# Patient Record
Sex: Female | Born: 1995 | Race: White | Hispanic: No | Marital: Married | Smoking: Never smoker
Health system: Southern US, Community
[De-identification: ages and names within clinical notes are randomized; demographics above are authoritative.]

## PROBLEM LIST (undated history)

## (undated) ENCOUNTER — Inpatient Hospital Stay: Payer: Self-pay

## (undated) DIAGNOSIS — F338 Other recurrent depressive disorders: Secondary | ICD-10-CM

## (undated) DIAGNOSIS — Z789 Other specified health status: Secondary | ICD-10-CM

## (undated) HISTORY — PX: NO PAST SURGERIES: SHX2092

## (undated) HISTORY — DX: Other recurrent depressive disorders: F33.8

---

## 2016-04-27 ENCOUNTER — Ambulatory Visit (INDEPENDENT_AMBULATORY_CARE_PROVIDER_SITE_OTHER): Payer: 59 | Admitting: Obstetrics and Gynecology

## 2016-04-27 ENCOUNTER — Encounter: Payer: Self-pay | Admitting: Obstetrics and Gynecology

## 2016-04-27 VITALS — BP 124/77 | HR 107 | Ht 67.0 in | Wt 180.1 lb

## 2016-04-27 DIAGNOSIS — N979 Female infertility, unspecified: Secondary | ICD-10-CM | POA: Diagnosis not present

## 2016-04-27 LAB — POCT URINE PREGNANCY: PREG TEST UR: NEGATIVE

## 2016-04-27 NOTE — Progress Notes (Signed)
HPI:      [name] is a 21 y.o. G1P1001 who LMP was Patient's last menstrual period was 04/24/2016 (exact date).  Subjective:   She presents today Stating that she has had unprotected intercourse for 4 years without resultant pregnancy. She reports that her menses are monthly and occur approximately at the same time. The exception is this last month her period was 2 weeks late and she states that over the last few months.. Has been a little bit irregular for her. She has done home pregnancy tests and they have occasionally been equivocal, so she is not sure if she has been pregnant and miscarried or not. She is currently bleeding like a menstrual period but it is 2 weeks late and would like a pregnancy test today. She does desire fertility. She and her current partner have a 67-year-old child.    Hx: The following portions of the patient's history were reviewed and updated as appropriate:              She  has no past medical history on file. She  does not have a problem list on file. She  has no past surgical history on file. Her family history includes Cancer in her maternal grandfather; Thyroid disease in her mother. She  reports that she has never smoked. She has never used smokeless tobacco. She reports that she drinks alcohol. She reports that she does not use drugs. No current outpatient prescriptions on file prior to visit.   No current facility-administered medications on file prior to visit.          Review of Systems:  Review of Systems  Constitutional: Denied constitutional symptoms, night sweats, recent illness, fatigue, fever, insomnia and weight loss.  Eyes: Denied eye symptoms, eye pain, photophobia, vision change and visual disturbance.  Ears/Nose/Throat/Neck: Denied ear, nose, throat or neck symptoms, hearing loss, nasal discharge, sinus congestion and sore throat.  Cardiovascular: Denied cardiovascular symptoms, arrhythmia, chest pain/pressure, edema, exercise  intolerance, orthopnea and palpitations.  Respiratory: Denied pulmonary symptoms, asthma, pleuritic pain, productive sputum, cough, dyspnea and wheezing.  Gastrointestinal: Denied, gastro-esophageal reflux, melena, nausea and vomiting.  Genitourinary: Denied genitourinary symptoms including symptomatic vaginal discharge, pelvic relaxation issues, and urinary complaints.  Musculoskeletal: Denied musculoskeletal symptoms, stiffness, swelling, muscle weakness and myalgia.  Dermatologic: Denied dermatology symptoms, rash and scar.  Neurologic: Denied neurology symptoms, dizziness, headache, neck pain and syncope.  Psychiatric: Denied psychiatric symptoms, anxiety and depression.  Endocrine: Denied endocrine symptoms including hot flashes and night sweats.   Meds:   No current outpatient prescriptions on file prior to visit.   No current facility-administered medications on file prior to visit.     Objective:     Vitals:   04/27/16 0836  BP: 124/77  Pulse: (!) 107    Urine pregnancy test negative            Assessment:    G1P1001 There are no active problems to display for this patient.    1. 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 Also possible this is a female factor problem.   Plan:            1.  PCO labs  2.  Semen analysis  3. We have discussed menstrual timing and use of ovulation predictor kits as well.    Orders Orders Placed This Encounter  Procedures  . DHEA-sulfate  . FSH/LH  .  Prolactin  . Testosterone, Free, Total, SHBG  . TSH  . Semen Analysis, Basic  . POCT urine pregnancy     Meds ordered this encounter  Medications  . b complex vitamins capsule    Sig: Take 1 capsule by mouth daily.        F/U  Return in about 2 weeks (around 05/11/2016). I spent 30 minutes with this patient of which greater than 50% was spent discussing infertility causes and  treatments, female infertility, timing of intercourse, ovulation predictor kits etc.   Elonda Huskyavid J. Morgana Rowley, M.D. 04/27/2016 10:07 AM

## 2016-05-03 ENCOUNTER — Other Ambulatory Visit: Payer: Self-pay

## 2016-05-11 ENCOUNTER — Ambulatory Visit: Payer: 59 | Admitting: Obstetrics and Gynecology

## 2016-08-25 ENCOUNTER — Ambulatory Visit (INDEPENDENT_AMBULATORY_CARE_PROVIDER_SITE_OTHER): Payer: 59 | Admitting: Obstetrics and Gynecology

## 2016-08-25 ENCOUNTER — Encounter: Payer: Self-pay | Admitting: Obstetrics and Gynecology

## 2016-08-25 VITALS — BP 113/75 | HR 80 | Ht 67.0 in | Wt 187.4 lb

## 2016-08-25 DIAGNOSIS — N926 Irregular menstruation, unspecified: Secondary | ICD-10-CM

## 2016-08-25 LAB — POCT URINE PREGNANCY: Preg Test, Ur: POSITIVE — AB

## 2016-08-25 NOTE — Progress Notes (Signed)
HPI:      [name] is a 21 y.o. G2P1001 who LMP was Patient's last menstrual period was 07/06/2016 (exact date).  Subjective:   She presents today For pregnancy confirmation. This was a planned pregnancy. She is approximate 7 weeks by last menstrual period. She has nausea but no vomiting. Other signs of pregnancy include breast tenderness. Her last pregnancy was uncomplicated. She was induced at term and had a very rapid labor.-Vaginal birth.    Hx: The following portions of the patient's history were reviewed and updated as appropriate:             She  has no past medical history on file. She  does not have a problem list on file. She  has no past surgical history on file. Her family history includes Cancer in her maternal grandfather; Thyroid disease in her mother. She  reports that she has never smoked. She has never used smokeless tobacco. She reports that she does not drink alcohol or use drugs. She has no allergies on file.       Review of Systems:  Review of Systems  Constitutional: Denied constitutional symptoms, night sweats, recent illness, fatigue, fever, insomnia and weight loss.  Eyes: Denied eye symptoms, eye pain, photophobia, vision change and visual disturbance.  Ears/Nose/Throat/Neck: Denied ear, nose, throat or neck symptoms, hearing loss, nasal discharge, sinus congestion and sore throat.  Cardiovascular: Denied cardiovascular symptoms, arrhythmia, chest pain/pressure, edema, exercise intolerance, orthopnea and palpitations.  Respiratory: Denied pulmonary symptoms, asthma, pleuritic pain, productive sputum, cough, dyspnea and wheezing.  Gastrointestinal: Denied, gastro-esophageal reflux, melena, nausea and vomiting.  Genitourinary: Denied genitourinary symptoms including symptomatic vaginal discharge, pelvic relaxation issues, and urinary complaints.  Musculoskeletal: Denied musculoskeletal symptoms, stiffness, swelling, muscle weakness and myalgia.   Dermatologic: Denied dermatology symptoms, rash and scar.  Neurologic: Denied neurology symptoms, dizziness, headache, neck pain and syncope.  Psychiatric: Denied psychiatric symptoms, anxiety and depression.  Endocrine: Denied endocrine symptoms including hot flashes and night sweats.   Meds:   Current Outpatient Prescriptions on File Prior to Visit  Medication Sig Dispense Refill  . b complex vitamins capsule Take 1 capsule by mouth daily.     No current facility-administered medications on file prior to visit.     Objective:     Vitals:   08/25/16 1342  BP: 113/75  Pulse: 80              Urine HCG test positive  Assessment:    G2P1001 There are no active problems to display for this patient.    1. Missed menses     Partially 7 weeks estimated gestational age.   Plan:           Prenatal Plan 1.  The patient was given prenatal literature. 2.  She was continued on prenatal vitamins. 3.  A prenatal lab panel was ordered or drawn. 4.  An ultrasound was ordered to better determine an EDC. 5.  A nurse visit was scheduled. 6.  Pap GC/CT performed with pelvic exam.  Orders Orders Placed This Encounter  Procedures  . US OB Comp Less 14 Wks  . POCT urine pregnancy    No orders of the defined types were placed in this encounter.       F/U  Return in about 5 weeks (around 09/29/2016). I spent 30 minutes with this patient of which greater than 50% was spent discussing prenatal history, current condition, prenatal vitamins, future care at [facility], future visits.  Onalee Huaavid  Danne HarborJ. Marquell Saenz, M.D. 08/25/2016 2:04 PM

## 2016-09-01 ENCOUNTER — Ambulatory Visit (INDEPENDENT_AMBULATORY_CARE_PROVIDER_SITE_OTHER): Payer: 59

## 2016-09-01 DIAGNOSIS — N926 Irregular menstruation, unspecified: Secondary | ICD-10-CM | POA: Diagnosis not present

## 2016-09-14 ENCOUNTER — Ambulatory Visit: Payer: 59 | Admitting: Obstetrics and Gynecology

## 2016-09-14 VITALS — BP 104/64 | HR 58 | Wt 182.2 lb

## 2016-09-14 DIAGNOSIS — Z3492 Encounter for supervision of normal pregnancy, unspecified, second trimester: Secondary | ICD-10-CM

## 2016-09-14 NOTE — Progress Notes (Signed)
Elizabeth Rush presents for NOB nurse interview visit. Pregnancy confirmation done here at [facility]. G- .2  P- 1. Pregnancy education material explained and given_There is cats in the home however she doesn't change the litter box. HIV labs and Drug screen were explained optional and she did not decline. Drug screen ordered PNV encouraged. Genetic screening options discussed. Genetic testing:/Unsure.  Pt may discuss with provider. Pt. To follow up with provider in _2_ weeks for NOB physical.  All questions answered.

## 2016-09-19 ENCOUNTER — Encounter: Payer: Self-pay | Admitting: Emergency Medicine

## 2016-09-19 ENCOUNTER — Emergency Department
Admission: EM | Admit: 2016-09-19 | Discharge: 2016-09-19 | Disposition: A | Discharge status: Home / Self Care | Payer: BLUE CROSS/BLUE SHIELD | Attending: Emergency Medicine | Admitting: Emergency Medicine

## 2016-09-19 DIAGNOSIS — Z711 Person with feared health complaint in whom no diagnosis is made: Secondary | ICD-10-CM | POA: Insufficient documentation

## 2016-09-19 DIAGNOSIS — L819 Disorder of pigmentation, unspecified: Secondary | ICD-10-CM | POA: Diagnosis present

## 2016-09-19 NOTE — ED Triage Notes (Signed)
Pt ambulatory to triage stating that she was bit by a tick about a month and a half ago. Tick was removed at the walk in clinic. Pt states that this morning she noticed a "tape worm" in her ankle where the tick bit her.Pt denies fever and body aches. Pt is approximately [redacted] weeks pregnant. No complaints related to pregnancy at this time.

## 2016-09-19 NOTE — Discharge Instructions (Signed)
Follow-up with your primary care doctor if any continued problems. Wear socks with shoes.

## 2016-09-19 NOTE — ED Provider Notes (Signed)
[facility] Emergency Department Provider Note  ____________________________________________   First MD Initiated Contact with Patient 09/19/16 1049     (approximate)  I have reviewed the triage vital signs and the nursing notes.   HISTORY  Chief Complaint tick bite   HPI Elizabeth Rush is a 21 y.o. female is here with complaint of an area on the back for ankle. Patient states that the skin looks different and is worried. Patient states that she was bit by a tick approximately one half months ago. She denies any fever or chills. She denies any itching. Husband states that she does not wear socks with her shoes. She denies any pain. Patient is [redacted] weeks pregnant.   Past Medical History:  Diagnosis Date  . Seasonal affective disorder (HCC)     There are no active problems to display for this patient.   History reviewed. No pertinent surgical history.  Prior to Admission medications   Medication Sig Start Date End Date Taking? Authorizing Provider  b complex vitamins capsule Take 1 capsule by mouth daily.    [provider]    Allergies Patient has no known allergies.  Family History  Problem Relation Age of Onset  . Thyroid disease Mother   . Cancer Maternal Grandfather     Social History Social History  Substance Use Topics  . Smoking status: Never Smoker  . Smokeless tobacco: Never Used  . Alcohol use No     Comment: occas    Review of Systems Constitutional: No fever/chills Cardiovascular: Denies chest pain. Respiratory: Denies shortness of breath. Skin: Positive for questionable skin problem to heal. Neurological: Negative for headaches, focal weakness or numbness.   ____________________________________________   PHYSICAL EXAM:  VITAL SIGNS: ED Triage Vitals [09/19/16 1001]  Enc Vitals Group     BP (!) 121/95     Pulse Rate 82     Resp 16     Temp 98.5 F (36.9 C)     Temp Source Oral     SpO2 100 %   Weight 181 lb (82.1 kg)     Height      Head Circumference      Peak Flow      Pain Score      Pain Loc      Pain Edu?      Excl. in GC?     Constitutional: Alert and oriented. Well appearing and in no acute distress. Eyes: Conjunctivae are normal. PERRL. EOMI. Head: Atraumatic. Neck: No stridor.   Cardiovascular: Normal rate, regular rhythm. Grossly normal heart sounds.  Good peripheral circulation. Respiratory: Normal respiratory effort.  No retractions. Lungs CTAB. Musculoskeletal: Examination of the foot and ankle there is no gross deformity and no soft tissue swelling present. Range of motion is without restriction. Nontender to palpation. Neurologic:  Normal speech and language. No gross focal neurologic deficits are appreciated. No gait instability. Skin:  Skin is warm, dry. There is a very superficial linear of skin that has been removed. This is questionably from wearing shoes without any socks. Area does not show any signs of infection. Psychiatric: Mood and affect are normal. Speech and behavior are normal.  ____________________________________________   LABS (all labs ordered are listed, but only abnormal results are displayed)  Labs Reviewed - No data to display   PROCEDURES  Procedure(s) performed: None  Procedures  Critical Care performed: No  ____________________________________________   INITIAL IMPRESSION / ASSESSMENT AND PLAN / ED COURSE  Pertinent labs &  imaging results that were available during my care of the patient were reviewed by me and considered in my medical decision making (see chart for details).  Patient was reassured that this is not a ringworm or complications from her tick bite. She was reassured that there is no skin infection. She'll follow-up with her PCP if any continued problems.   ____________________________________________   FINAL CLINICAL IMPRESSION(S) / ED DIAGNOSES  Final diagnoses:  Person with feared complaint, no  diagnosis made      NEW MEDICATIONS STARTED DURING THIS VISIT:  Discharge Medication List as of 09/19/2016 11:25 AM       Note:  This document was prepared using Dragon voice recognition software and may include unintentional dictation errors.    Tommi Rumps, PA-C 09/19/16 1339    Tommi Rumps, PA-C 09/19/16 1341    Phineas Semen, MD 09/19/16 [phone]

## 2016-09-29 ENCOUNTER — Encounter: Payer: 59 | Admitting: Obstetrics and Gynecology

## 2016-09-29 ENCOUNTER — Encounter: Payer: Self-pay | Admitting: Certified Nurse Midwife

## 2016-09-29 ENCOUNTER — Ambulatory Visit (INDEPENDENT_AMBULATORY_CARE_PROVIDER_SITE_OTHER): Payer: 59 | Admitting: Certified Nurse Midwife

## 2016-09-29 VITALS — BP 125/87 | HR 85 | Wt 179.8 lb

## 2016-09-29 DIAGNOSIS — Z3A12 12 weeks gestation of pregnancy: Secondary | ICD-10-CM

## 2016-09-29 DIAGNOSIS — Z124 Encounter for screening for malignant neoplasm of cervix: Secondary | ICD-10-CM

## 2016-09-29 DIAGNOSIS — Z3481 Encounter for supervision of other normal pregnancy, first trimester: Secondary | ICD-10-CM

## 2016-09-29 NOTE — Addendum Note (Signed)
Addended by: Jackquline DenmarkIDGEWAY, Lissy Deuser W on: 09/29/2016 04:58 PM   Modules accepted: Orders

## 2016-09-29 NOTE — Patient Instructions (Signed)

## 2016-09-29 NOTE — Progress Notes (Signed)
NEW OB HISTORY AND PHYSICAL  SUBJECTIVE:       Elizabeth Rush is a 21 y.o. G73P1001 female, Patient's last menstrual period was 07/06/2016 (exact date)., Estimated Date of Delivery: 04/12/17, [redacted]w[redacted]d, presents today for establishment of Prenatal Care. She has no unusual complaints    Gynecologic History Patient's last menstrual period was 07/06/2016 (exact date). Normal Contraception: none Last Pap: a few yrs ago Results were: normal  Obstetric History OB History  Gravida Para Term Preterm AB Living  2 1 1     1   SAB TAB Ectopic Multiple Live Births          1    # Outcome Date GA Lbr Len/2nd Weight Sex Delivery Anes PTL Lv  2 Current           1 Term 2014    F Vag-Spont  N LIV      Past Medical History:  Diagnosis Date  . Seasonal affective disorder (HCC)     No past surgical history on file.  Current Outpatient Prescriptions on File Prior to Visit  Medication Sig Dispense Refill  . b complex vitamins capsule Take 1 capsule by mouth daily.     No current facility-administered medications on file prior to visit.     No Known Allergies  Social History   Social History  . Marital status: Married    Spouse name: N/A  . Number of children: N/A  . Years of education: N/A   Occupational History  . Not on file.   Social History Main Topics  . Smoking status: Never Smoker  . Smokeless tobacco: Never Used  . Alcohol use No     Comment: occas  . Drug use: No  . Sexual activity: Yes    Birth control/ protection: None   Other Topics Concern  . Not on file   Social History Narrative  . No narrative on file    Family History  Problem Relation Age of Onset  . Thyroid disease Mother   . Cancer Maternal Grandfather     The following portions of the patient's history were reviewed and updated as appropriate: allergies, current medications, past OB history, past medical history, past surgical history, past family history, past social history, and problem  list.    OBJECTIVE: Initial Physical Exam (New OB)  GENERAL APPEARANCE: alert, well appearing, in no apparent distress, oriented to person, place and time HEAD: normocephalic, atraumatic MOUTH: mucous membranes moist, pharynx normal without lesions THYROID: no thyromegaly or masses present and enlarged on left side, pt states she has had it checked. BREASTS: no masses noted, no significant tenderness, no palpable axillary nodes, no skin changes LUNGS: clear to auscultation, no wheezes, rales or rhonchi, symmetric air entry HEART: regular rate and rhythm, no murmurs ABDOMEN: soft, nontender, nondistended, no abnormal masses, no epigastric pain and FHT present EXTREMITIES: no redness or tenderness in the calves or thighs, no edema, no limitation in range of motion SKIN: normal coloration and turgor, no rashes LYMPH NODES: no adenopathy palpable NEUROLOGIC: alert, oriented, normal speech, no focal findings or movement disorder noted  PELVIC EXAM EXTERNAL GENITALIA: normal appearing vulva with no masses, tenderness or lesions VAGINA: no abnormal discharge or lesions CERVIX: no lesions or cervical motion tenderness and contact bleeding with pap smear UTERUS: gravid ADNEXA: no masses palpable and nontender OB EXAM PELVIMETRY: appears adequate RECTUM: exam not indicated  ASSESSMENT: Normal pregnancy  PLAN: New OB counseling: The patient has been given an overview regarding routine prenatal  care. Recommendations regarding diet, weight gain, and exercise in pregnancy were given. Prenatal testing, optional genetic testing, and carrier screening discussed.  Ultrasound use in pregnancy were reviewed. She denies taking PNV at this time stating that it does not make her feel any better. Reviewed benefits of PNV with folic acid, discussed using [employer] vitamins and folic acid or offered to give her additional samples . She declines samples stating that she will get [employer] vitamins & folic  acid.   Benefits of Breast Feeding were discussed. The patient is encouraged to consider nursing her baby post partum.  Doreene Burke, CNM

## 2016-09-29 NOTE — Addendum Note (Signed)
Addended by: Jackquline Denmark on: 09/29/2016 04:34 PM   Modules accepted: Orders

## 2016-09-30 LAB — URINALYSIS, ROUTINE W REFLEX MICROSCOPIC
BILIRUBIN UA: NEGATIVE
GLUCOSE, UA: NEGATIVE
KETONES UA: NEGATIVE
LEUKOCYTES UA: NEGATIVE
NITRITE UA: NEGATIVE
Protein, UA: NEGATIVE
RBC UA: NEGATIVE
SPEC GRAV UA: 1.018 (ref 1.005–1.030)
Urobilinogen, Ur: 0.2 mg/dL (ref 0.2–1.0)
pH, UA: 7 (ref 5.0–7.5)

## 2016-09-30 LAB — ABO AND RH: RH TYPE: POSITIVE

## 2016-09-30 LAB — CBC WITH DIFFERENTIAL/PLATELET
BASOS: 0 %
Basophils Absolute: 0 10*3/uL (ref 0.0–0.2)
EOS (ABSOLUTE): 0 10*3/uL (ref 0.0–0.4)
EOS: 0 %
HEMOGLOBIN: 14 g/dL (ref 11.1–15.9)
Hematocrit: 40.5 % (ref 34.0–46.6)
IMMATURE GRANS (ABS): 0 10*3/uL (ref 0.0–0.1)
Immature Granulocytes: 0 %
LYMPHS: 22 %
Lymphocytes Absolute: 2.2 10*3/uL (ref 0.7–3.1)
MCH: 29.4 pg (ref 26.6–33.0)
MCHC: 34.6 g/dL (ref 31.5–35.7)
MCV: 85 fL (ref 79–97)
MONOCYTES: 7 %
Monocytes Absolute: 0.7 10*3/uL (ref 0.1–0.9)
NEUTROS ABS: 7 10*3/uL (ref 1.4–7.0)
Neutrophils: 71 %
Platelets: 286 10*3/uL (ref 150–379)
RBC: 4.76 x10E6/uL (ref 3.77–5.28)
RDW: 13.4 % (ref 12.3–15.4)
WBC: 10 10*3/uL (ref 3.4–10.8)

## 2016-09-30 LAB — VARICELLA ZOSTER ANTIBODY, IGG: Varicella zoster IgG: 2296 index (ref >165)

## 2016-09-30 LAB — HEPATITIS B SURFACE ANTIGEN: Hepatitis B Surface Ag: NEGATIVE

## 2016-09-30 LAB — RPR: RPR Ser Ql: NONREACTIVE

## 2016-09-30 LAB — HIV ANTIBODY (ROUTINE TESTING W REFLEX): HIV Screen 4th Generation wRfx: NONREACTIVE

## 2016-09-30 LAB — RUBELLA SCREEN: RUBELLA: 2.85 {index} (ref >0.99)

## 2016-09-30 LAB — ANTIBODY SCREEN: Antibody Screen: NEGATIVE

## 2016-10-01 LAB — MONITOR DRUG PROFILE 14(MW)
Amphetamine Scrn, Ur: NEGATIVE ng/mL
BARBITURATE SCREEN URINE: NEGATIVE ng/mL
BENZODIAZEPINE SCREEN, URINE: NEGATIVE ng/mL
BUPRENORPHINE, URINE: NEGATIVE ng/mL
CANNABINOIDS UR QL SCN: NEGATIVE ng/mL
CREATININE(CRT), U: 97.3 mg/dL (ref 20.0–300.0)
Cocaine (Metab) Scrn, Ur: NEGATIVE ng/mL
Fentanyl, Urine: NEGATIVE pg/mL
METHADONE SCREEN, URINE: NEGATIVE ng/mL
Meperidine Screen, Urine: NEGATIVE ng/mL
OPIATE SCREEN URINE: NEGATIVE ng/mL
OXYCODONE+OXYMORPHONE UR QL SCN: NEGATIVE ng/mL
PHENCYCLIDINE QUANTITATIVE URINE: NEGATIVE ng/mL
Ph of Urine: 6.8 (ref 4.5–8.9)
Propoxyphene Scrn, Ur: NEGATIVE ng/mL
SPECIFIC GRAVITY: 1.021
Tramadol Screen, Urine: NEGATIVE ng/mL

## 2016-10-01 LAB — NICOTINE SCREEN, URINE: COTININE UR QL SCN: NEGATIVE ng/mL

## 2016-10-01 LAB — URINE CULTURE: ORGANISM ID, BACTERIA: NO GROWTH

## 2016-10-01 LAB — GC/CHLAMYDIA PROBE AMP
Chlamydia trachomatis, NAA: NEGATIVE
NEISSERIA GONORRHOEAE BY PCR: NEGATIVE

## 2016-10-06 LAB — PAP IG W/ RFLX HPV ASCU: PAP Smear Comment: 0

## 2016-10-27 ENCOUNTER — Ambulatory Visit (INDEPENDENT_AMBULATORY_CARE_PROVIDER_SITE_OTHER): Payer: 59 | Admitting: Obstetrics and Gynecology

## 2016-10-27 ENCOUNTER — Encounter: Payer: Self-pay | Admitting: Obstetrics and Gynecology

## 2016-10-27 VITALS — BP 117/72 | HR 88 | Wt 177.1 lb

## 2016-10-27 DIAGNOSIS — Z3492 Encounter for supervision of normal pregnancy, unspecified, second trimester: Secondary | ICD-10-CM

## 2016-10-27 LAB — POCT URINALYSIS DIPSTICK
BILIRUBIN UA: NEGATIVE
Blood, UA: NEGATIVE
Glucose, UA: NEGATIVE
KETONES UA: NEGATIVE
LEUKOCYTES UA: NEGATIVE
Nitrite, UA: NEGATIVE
PROTEIN UA: NEGATIVE
Spec Grav, UA: 1.01 (ref 1.010–1.025)
Urobilinogen, UA: 0.2 E.U./dL
pH, UA: 7 (ref 5.0–8.0)

## 2016-10-27 NOTE — Progress Notes (Signed)
[name]-doing well except tiredness. Anatomy scan next visit.

## 2016-10-27 NOTE — Progress Notes (Signed)
[name]- pt is doing well, denies any complaints 

## 2016-11-25 ENCOUNTER — Ambulatory Visit (INDEPENDENT_AMBULATORY_CARE_PROVIDER_SITE_OTHER): Payer: 59 | Admitting: Certified Nurse Midwife

## 2016-11-25 ENCOUNTER — Ambulatory Visit (INDEPENDENT_AMBULATORY_CARE_PROVIDER_SITE_OTHER): Payer: 59

## 2016-11-25 VITALS — BP 103/72 | HR 69 | Wt 175.4 lb

## 2016-11-25 DIAGNOSIS — Z3492 Encounter for supervision of normal pregnancy, unspecified, second trimester: Secondary | ICD-10-CM | POA: Diagnosis not present

## 2016-11-25 NOTE — Progress Notes (Signed)
Pt had anatomy scan today but unable to see provider due to provider being at hospital with deliveries. They asked if they could see provider in 2 wks when they have an anatomy f/u due to some views due to positioning of baby. Pt states she has no issue/concerns.

## 2016-11-25 NOTE — Progress Notes (Signed)
Telephone call to patient verified full name and date of birth. Anatomy scan reviewed with pt-incomplete yet normal, verbalized understanding. Reviewed red flag symptoms and when to call. RTC x 2 weeks for follow US and [name] or sooner if needed.    ULTRASOUND REPORT  Location: [facility] Date of Service: 11/25/16  Indications:Anatomy Findings:  Mason JimSingleton intrauterine pregnancy is visualized with FHR at 158 BPM. Biometrics give an (U/S) Gestational age of 21 6/7 weeks and an (U/S) EDD of 04/15/17; this correlates with the clinically established EDD of 04/12/17.  Fetal presentation is footling breech.  EFW: 325 grams (0lb 11oz). Placenta: Anterior and grade 1.  Placenta is 5.8cm from cervical os. AFI: WNL subjectively.  Anatomic survey is incomplete.  Views of the 4-chamber heart, kidneys, profile, open hand, and 5th digit are needed to complete survey. Gender - does not want to know at this time.   Right Ovary measures 2.9 x 2.3 x 2.6 cm and appears WNL.  Left Ovary measures 4.7 x 2.4 x 3.5 cm and appears WNL.  There is no obvious evidence of a corpus luteal cyst. Survey of the adnexa demonstrates no adnexal masses. There is no free peritoneal fluid in the cul de sac.  Impression: 1. 19 6/7 week Viable Singleton Intrauterine pregnancy by U/S. 2. (U/S) EDD is consistent with Clinically established (LMP) EDD of 04/12/17. 3. Incomplete anatomy scan  Recommendations: 1.Clinical correlation with the patient's History and Physical Exam. 2. F/U U/S recommended in 2 weeks to complete anatomical survey.

## 2016-12-13 ENCOUNTER — Ambulatory Visit (INDEPENDENT_AMBULATORY_CARE_PROVIDER_SITE_OTHER): Payer: 59

## 2016-12-13 ENCOUNTER — Ambulatory Visit (INDEPENDENT_AMBULATORY_CARE_PROVIDER_SITE_OTHER): Payer: 59 | Admitting: Certified Nurse Midwife

## 2016-12-13 DIAGNOSIS — Z3492 Encounter for supervision of normal pregnancy, unspecified, second trimester: Secondary | ICD-10-CM | POA: Diagnosis not present

## 2016-12-13 DIAGNOSIS — Z3689 Encounter for other specified antenatal screening: Secondary | ICD-10-CM

## 2016-12-13 NOTE — Progress Notes (Signed)
Patient unable to stay for [name]; however anatomy scan complete and normal.   ULTRASOUND REPORT  Location: [facility] Date of Service:  12/13/16  Indications: F/U Anatomy Findings:  Singleton intrauterine pregnancy is visualized with FHR at 150 BPM.  Fetal presentation is vertex.  Placenta: Anterior and grade 1. AFI: WNL subjectively.  Anatomic survey is now complete after obtaining images of the 4-chamber heart, kidneys, and profile.  Fetal stomach, kidneys, and bladder appears WNL. Gender - Female.    Impression: 1. Anatomy scan is now complete and appears WNL.  Recommendations: 1.Clinical correlation with the patient's History and Physical Exam.

## 2017-01-10 ENCOUNTER — Encounter: Payer: 59 | Admitting: Certified Nurse Midwife

## 2017-01-14 ENCOUNTER — Ambulatory Visit (INDEPENDENT_AMBULATORY_CARE_PROVIDER_SITE_OTHER): Payer: No Typology Code available for payment source | Admitting: Certified Nurse Midwife

## 2017-01-14 ENCOUNTER — Encounter: Payer: Self-pay | Admitting: Certified Nurse Midwife

## 2017-01-14 VITALS — BP 105/79 | HR 94 | Wt 177.6 lb

## 2017-01-14 DIAGNOSIS — Z23 Encounter for immunization: Secondary | ICD-10-CM

## 2017-01-14 DIAGNOSIS — Z3482 Encounter for supervision of other normal pregnancy, second trimester: Secondary | ICD-10-CM

## 2017-01-14 LAB — POCT URINALYSIS DIPSTICK
Blood, UA: NEGATIVE
Glucose, UA: NEGATIVE
KETONES UA: NEGATIVE
NITRITE UA: NEGATIVE
ODOR: NEGATIVE
PH UA: 7.5 (ref 5.0–8.0)
Spec Grav, UA: 1.015 (ref 1.010–1.025)
UROBILINOGEN UA: 0.2 U/dL

## 2017-01-14 NOTE — Progress Notes (Signed)
[name], doing well no complains . Since she is a little early will have her return in 1 wk for gtt. Tdap/BTC today. Follow up 1 wk for labs then 2 1/2 wks for [name] .   Doreene BurkeAnnie Eriyana Rush, CNM

## 2017-01-14 NOTE — Progress Notes (Signed)
[name]- tdap and btc today. Declines flu vaccine.

## 2017-01-17 ENCOUNTER — Other Ambulatory Visit: Payer: No Typology Code available for payment source

## 2017-01-17 DIAGNOSIS — Z3492 Encounter for supervision of normal pregnancy, unspecified, second trimester: Secondary | ICD-10-CM

## 2017-01-17 DIAGNOSIS — Z130 Encounter for screening for diseases of the blood and blood-forming organs and certain disorders involving the immune mechanism: Secondary | ICD-10-CM

## 2017-01-17 DIAGNOSIS — Z131 Encounter for screening for diabetes mellitus: Secondary | ICD-10-CM

## 2017-01-18 LAB — CBC
HEMOGLOBIN: 11.9 g/dL (ref 11.1–15.9)
Hematocrit: 34.9 % (ref 34.0–46.6)
MCH: 29.5 pg (ref 26.6–33.0)
MCHC: 34.1 g/dL (ref 31.5–35.7)
MCV: 87 fL (ref 79–97)
Platelets: 236 10*3/uL (ref 150–379)
RBC: 4.03 x10E6/uL (ref 3.77–5.28)
RDW: 13.3 % (ref 12.3–15.4)
WBC: 8 10*3/uL (ref 3.4–10.8)

## 2017-01-18 LAB — RPR: RPR: NONREACTIVE

## 2017-01-18 LAB — GLUCOSE, 1 HOUR GESTATIONAL: GESTATIONAL DIABETES SCREEN: 92 mg/dL (ref 65–139)

## 2017-02-01 NOTE — L&D Delivery Note (Signed)
      Delivery Note   Nils PyleKimberly Noh is a 22 y.o. G2P2002 at 8727w3d Estimated Date of Delivery: 04/12/17  PRE-OPERATIVE DIAGNOSIS:  1) 7227w3d pregnancy.   POST-OPERATIVE DIAGNOSIS:  1) 127w3d pregnancy s/p Vaginal, Spontaneous   Delivery Type: Vaginal, Spontaneous    Delivery Anesthesia: None   Labor Complications:   precipitous deliver    ESTIMATED BLOOD LOSS: 150 ml    FINDINGS:   1) female infant, Apgar scores of 8    at 1 minute and 9    at 5 minutes and a birthweight of 111.46  ounces.    2) Nuchal cord: yes, tight-reduced  SPECIMENS:   PLACENTA:   Appearance: Intact , 3 vessels, cord blood sample collected   Removal: Spontaneous      Disposition:   held per protocol then discarded  DISPOSITION:  Infant to left in stable condition in the delivery room, with L&D personnel and mother,  NARRATIVE SUMMARY: Labor course:  Ms. Nils PyleKimberly Vogelsang is a [id number] at 4327w3d who presented for labor management.  She progressed well in labor without pitocin.  She received no anesthesia and proceeded to complete dilation. She evidenced good maternal expulsive effort during the second stage. She went on to deliver a viable female infant " Jerrye NobleWilliam David". The placenta delivered without problems and was noted to be complete. A perineal and vaginal examination was performed. Episiotomy/Lacerations:   None. The patient tolerated this well.  Doreene Burkennie Betsy Rosello, CNM  04/08/2017 5:13 PM

## 2017-02-04 ENCOUNTER — Ambulatory Visit (INDEPENDENT_AMBULATORY_CARE_PROVIDER_SITE_OTHER): Payer: No Typology Code available for payment source | Admitting: Obstetrics and Gynecology

## 2017-02-04 VITALS — BP 108/75 | HR 99 | Wt 180.7 lb

## 2017-02-04 DIAGNOSIS — Z3493 Encounter for supervision of normal pregnancy, unspecified, third trimester: Secondary | ICD-10-CM

## 2017-02-04 LAB — POCT URINALYSIS DIPSTICK
BILIRUBIN UA: NEGATIVE
GLUCOSE UA: NEGATIVE
KETONES UA: NEGATIVE
Leukocytes, UA: NEGATIVE
Nitrite, UA: NEGATIVE
RBC UA: NEGATIVE
SPEC GRAV UA: 1.01 (ref 1.010–1.025)
UROBILINOGEN UA: 0.2 U/dL
pH, UA: 8 (ref 5.0–8.0)

## 2017-02-04 NOTE — Progress Notes (Signed)
[name]- DOING WELL, discussed circumcision,discussed waterbirth class and breast feeding.

## 2017-02-04 NOTE — Progress Notes (Signed)
[name]- pt is doing well, ever since pt drank her 1 hour, she hasnt been able to tolerate high fructose

## 2017-02-18 ENCOUNTER — Encounter: Payer: Self-pay | Admitting: Certified Nurse Midwife

## 2017-02-18 ENCOUNTER — Ambulatory Visit (INDEPENDENT_AMBULATORY_CARE_PROVIDER_SITE_OTHER): Payer: No Typology Code available for payment source | Admitting: Certified Nurse Midwife

## 2017-02-18 VITALS — BP 110/65 | HR 100 | Wt 180.0 lb

## 2017-02-18 DIAGNOSIS — Z3493 Encounter for supervision of normal pregnancy, unspecified, third trimester: Secondary | ICD-10-CM

## 2017-02-18 LAB — POCT URINALYSIS DIPSTICK
BILIRUBIN UA: NEGATIVE
Blood, UA: NEGATIVE
Glucose, UA: NEGATIVE
Ketones, UA: NEGATIVE
Leukocytes, UA: NEGATIVE
Nitrite, UA: NEGATIVE
Protein, UA: NEGATIVE
Spec Grav, UA: 1.01 (ref 1.010–1.025)
Urobilinogen, UA: 0.2 E.U./dL
pH, UA: 7.5 (ref 5.0–8.0)

## 2017-02-18 NOTE — Patient Instructions (Signed)

## 2017-02-18 NOTE — Progress Notes (Signed)
[name]- Pt states she is doing well, pt states she took water birth class and she would like to know if she qualifies

## 2017-02-19 NOTE — Progress Notes (Signed)
[name]-Doing well. Education regarding [facility] waterbirth policies; handouts given. Advised pt to bring copy of waterbirth completion certificate to next visit. Sent home with waterbirth consent to review and sign. Information given for tub rental options including the [facility]. Anticipatory guidance regarding course of prenatal care. Reviewed red flag symptoms and when to call. RTC x 2 weeks for [name] or sooner if needed.

## 2017-03-04 ENCOUNTER — Ambulatory Visit (INDEPENDENT_AMBULATORY_CARE_PROVIDER_SITE_OTHER): Payer: No Typology Code available for payment source | Admitting: Certified Nurse Midwife

## 2017-03-04 ENCOUNTER — Encounter: Payer: Self-pay | Admitting: Certified Nurse Midwife

## 2017-03-04 VITALS — BP 111/70 | HR 98 | Wt 177.7 lb

## 2017-03-04 DIAGNOSIS — Z3403 Encounter for supervision of normal first pregnancy, third trimester: Secondary | ICD-10-CM

## 2017-03-04 LAB — POCT URINALYSIS DIPSTICK
Bilirubin, UA: NEGATIVE
Glucose, UA: NEGATIVE
Ketones, UA: NEGATIVE
Leukocytes, UA: NEGATIVE
Nitrite, UA: NEGATIVE
PH UA: 6.5 (ref 5.0–8.0)
Spec Grav, UA: 1.025 (ref 1.010–1.025)
UROBILINOGEN UA: 0.2 U/dL

## 2017-03-04 NOTE — Progress Notes (Signed)
[name] doing well. Water birth consent form and certificate obtained today. Copy given to pt and scanned into chart. She is planning on using the labor ladies for her tub. Anticipatory guidance for 36 wk culture. She verbalizes understanding and agrees to plan. Follow up 1-1/2 wks.   Doreene BurkeAnnie Azharia Surratt, CNM

## 2017-03-04 NOTE — Patient Instructions (Signed)
Group B Streptococcus Infection During Pregnancy Group B Streptococcus (GBS) is a type of bacteria (Streptococcus agalactiae) that is often found in healthy people, commonly in the rectum, vagina, and intestines. In people who are healthy and not pregnant, the bacteria rarely cause serious illness or complications. However, women who test positive for GBS during pregnancy can pass the bacteria to their baby during childbirth, which can cause serious infection in the baby after birth. Women with GBS may also have infections during their pregnancy or immediately after childbirth, such as such as urinary tract infections (UTIs) or infections of the uterus (uterine infections). Having GBS also increases a woman's risk of complications during pregnancy, such as early (preterm) labor or delivery, miscarriage, or stillbirth. Routine testing (screening) for GBS is recommended for all pregnant women. What increases the risk? You may have a higher risk for GBS infection during pregnancy if you had one during a past pregnancy. What are the signs or symptoms? In most cases, GBS infection does not cause symptoms in pregnant women. Signs and symptoms of a possible GBS-related infection may include:  Labor starting before the 37th week of pregnancy.  A UTI or bladder infection, which may cause: ? Fever. ? Pain or burning during urination. ? Frequent urination.  Fever during labor, along with: ? Bad-smelling discharge. ? Uterine tenderness. ? Rapid heartbeat in the mother, baby, or both.  Rare but serious symptoms of a possible GBS-related infection in women include:  Blood infection (septicemia). This may cause fever, chills, or confusion.  Lung infection (pneumonia). This may cause fever, chills, cough, rapid breathing, difficulty breathing, or chest pain.  Bone, joint, skin, or soft tissue infection.  How is this diagnosed? You may be screened for GBS between week 35 and week 37 of your pregnancy. If  you have symptoms of preterm labor, you may be screened earlier. This condition is diagnosed based on lab test results from:  A swab of fluid from the vagina and rectum.  A urine sample.  How is this treated? This condition is treated with antibiotic medicine. When you go into labor, or as soon as your water breaks (your membranes rupture), you will be given antibiotics through an IV tube. Antibiotics will continue until after you give birth. If you are having a cesarean delivery, you do not need antibiotics unless your membranes have already ruptured. Follow these instructions at home:  Take over-the-counter and prescription medicines only as told by your health care provider.  Take your antibiotic medicine as told by your health care provider. Do not stop taking the antibiotic even if you start to feel better.  Keep all pre-birth (prenatal) visits and follow-up visits as told by your health care provider. This is important. Contact a health care provider if:  You have pain or burning when you urinate.  You have to urinate frequently.  You have a fever or chills.  You develop a bad-smelling vaginal discharge. Get help right away if:  Your membranes rupture.  You go into labor.  You have severe pain in your abdomen.  You have difficulty breathing.  You have chest pain. This information is not intended to replace advice given to you by your health care provider. Make sure you discuss any questions you have with your health care provider. Document Released: 04/27/2007 Document Revised: 08/15/2015 Document Reviewed: 08/14/2015 Elsevier Interactive Patient Education  2018 [employer]  

## 2017-03-04 NOTE — Progress Notes (Signed)
PT is doing well PT states that she is having braxton hicks contractions

## 2017-03-10 ENCOUNTER — Telehealth: Payer: Self-pay

## 2017-03-10 ENCOUNTER — Telehealth: Payer: Self-pay | Admitting: *Deleted

## 2017-03-10 NOTE — Telephone Encounter (Signed)
Spoke with pt- states she thinks she is getting Elizabeth Rush. States she is achy all over but the pains have gone away. Advised rest, tylenol and fluids.

## 2017-03-10 NOTE — Telephone Encounter (Signed)
Patient called and states that she was experiencing chest pain last night and lower back pain . Patient states those pains are gone now. She is wondering what could of caused that. Patient is requesting a call back. Her contact # is [phone]. Please advise. Thank you

## 2017-03-16 ENCOUNTER — Telehealth: Payer: Self-pay | Admitting: Certified Nurse Midwife

## 2017-03-16 NOTE — Telephone Encounter (Signed)
The patient called and stated that [facility] a [facility]. No other information was disclosed. Please advise.

## 2017-03-17 NOTE — Telephone Encounter (Signed)
Pt states she was dx with the flu approx 10 days ago. She is feeling better now. Only meds used were tylenol (1/2 tab prn). She states that since the flu her vaginal discharge has changed from white to slightly yellow. NO odor, burning, or pain. She is concerned that the baby is only moving 10 x in an hour and before the flu it was more. NO lof or vb. Pos for ctx at nite. 3 an hour at times. Does not keep her awake. Advised pt to stay hydrated. She may take tylenol es q6 prn. Aware as long as baby moves 3-4 times an hour that is normal. She will f/u with provider at [facility].

## 2017-03-18 ENCOUNTER — Ambulatory Visit (INDEPENDENT_AMBULATORY_CARE_PROVIDER_SITE_OTHER): Payer: No Typology Code available for payment source | Admitting: Certified Nurse Midwife

## 2017-03-18 VITALS — BP 108/76 | HR 107 | Wt 172.6 lb

## 2017-03-18 DIAGNOSIS — Z3403 Encounter for supervision of normal first pregnancy, third trimester: Secondary | ICD-10-CM

## 2017-03-18 LAB — POCT URINALYSIS DIPSTICK
Bilirubin, UA: NEGATIVE
Glucose, UA: NEGATIVE
Ketones, UA: NEGATIVE
LEUKOCYTES UA: NEGATIVE
NITRITE UA: NEGATIVE
PROTEIN UA: NEGATIVE
RBC UA: NEGATIVE
Spec Grav, UA: 1.025 (ref 1.010–1.025)
Urobilinogen, UA: 0.2 E.U./dL
pH, UA: 6 (ref 5.0–8.0)

## 2017-03-18 NOTE — Patient Instructions (Signed)
Vaginal Delivery Vaginal delivery means that you will give birth by pushing your baby out of your birth canal (vagina). A team of health care providers will help you before, during, and after vaginal delivery. Birth experiences are unique for every woman and every pregnancy, and birth experiences vary depending on where you choose to give birth. What should I do to prepare for my baby's birth? Before your baby is born, it is important to talk with your health care provider about:  Your labor and delivery preferences. These may include: ? Medicines that you may be given. ? How you will manage your pain. This might include non-medical pain relief techniques or injectable pain relief such as epidural analgesia. ? How you and your baby will be monitored during labor and delivery. ? Who may be in the labor and delivery room with you. ? Your feelings about surgical delivery of your baby (cesarean delivery, or C-section) if this becomes necessary. ? Your feelings about receiving donated blood through an IV tube (blood transfusion) if this becomes necessary.  Whether you are able: ? To take pictures or videos of the birth. ? To eat during labor and delivery. ? To move around, walk, or change positions during labor and delivery.  What to expect after your baby is born, such as: ? Whether delayed umbilical cord clamping and cutting is offered. ? Who will care for your baby right after birth. ? Medicines or tests that may be recommended for your baby. ? Whether breastfeeding is supported in your hospital or birth center. ? How long you will be in the hospital or birth center.  How any medical conditions you have may affect your baby or your labor and delivery experience.  To prepare for your baby's birth, you should also:  Attend all of your health care visits before delivery (prenatal visits) as recommended by your health care provider. This is important.  Prepare your home for your baby's  arrival. Make sure that you have: ? Diapers. ? Baby clothing. ? Feeding equipment. ? Safe sleeping arrangements for you and your baby.  Install a car seat in your vehicle. Have your car seat checked by a certified car seat installer to make sure that it is installed safely.  Think about who will help you with your new baby at home for at least the first several weeks after delivery.  What can I expect when I arrive at the birth center or hospital? Once you are in labor and have been admitted into the hospital or birth center, your health care provider may:  Review your pregnancy history and any concerns you have.  Insert an IV tube into one of your veins. This is used to give you fluids and medicines.  Check your blood pressure, pulse, temperature, and heart rate (vital signs).  Check whether your bag of water (amniotic sac) has broken (ruptured).  Talk with you about your birth plan and discuss pain control options.  Monitoring Your health care provider may monitor your contractions (uterine monitoring) and your baby's heart rate (fetal monitoring). You may need to be monitored:  Often, but not continuously (intermittently).  All the time or for long periods at a time (continuously). Continuous monitoring may be needed if: ? You are taking certain medicines, such as medicine to relieve pain or make your contractions stronger. ? You have pregnancy or labor complications.  Monitoring may be done by:  Placing a special stethoscope or a handheld monitoring device on your abdomen to   check your baby's heartbeat, and feeling your abdomen for contractions. This method of monitoring does not continuously record your baby's heartbeat or your contractions.  Placing monitors on your abdomen (external monitors) to record your baby's heartbeat and the frequency and length of contractions. You may not have to wear external monitors all the time.  Placing monitors inside of your uterus  (internal monitors) to record your baby's heartbeat and the frequency, length, and strength of your contractions. ? Your health care provider may use internal monitors if he or she needs more information about the strength of your contractions or your baby's heart rate. ? Internal monitors are put in place by passing a thin, flexible wire through your vagina and into your uterus. Depending on the type of monitor, it may remain in your uterus or on your baby's head until birth. ? Your health care provider will discuss the benefits and risks of internal monitoring with you and will ask for your permission before inserting the monitors.  Telemetry. This is a type of continuous monitoring that can be done with external or internal monitors. Instead of having to stay in bed, you are able to move around during telemetry. Ask your health care provider if telemetry is an option for you.  Physical exam Your health care provider may perform a physical exam. This may include:  Checking whether your baby is positioned: ? With the head toward your vagina (head-down). This is most common. ? With the head toward the top of your uterus (head-up or breech). If your baby is in a breech position, your health care provider may try to turn your baby to a head-down position so you can deliver vaginally. If it does not seem that your baby can be born vaginally, your provider may recommend surgery to deliver your baby. In rare cases, you may be able to deliver vaginally if your baby is head-up (breech delivery). ? Lying sideways (transverse). Babies that are lying sideways cannot be delivered vaginally.  Checking your cervix to determine: ? Whether it is thinning out (effacing). ? Whether it is opening up (dilating). ? How low your baby has moved into your birth canal.  What are the three stages of labor and delivery?  Normal labor and delivery is divided into the following three stages: Stage 1  Stage 1 is the  longest stage of labor, and it can last for hours or days. Stage 1 includes: ? Early labor. This is when contractions may be irregular, or regular and mild. Generally, early labor contractions are more than 10 minutes apart. ? Active labor. This is when contractions get longer, more regular, more frequent, and more intense. ? The transition phase. This is when contractions happen very close together, are very intense, and may last longer than during any other part of labor.  Contractions generally feel mild, infrequent, and irregular at first. They get stronger, more frequent (about every 2-3 minutes), and more regular as you progress from early labor through active labor and transition.  Many women progress through stage 1 naturally, but you may need help to continue making progress. If this happens, your health care provider may talk with you about: ? Rupturing your amniotic sac if it has not ruptured yet. ? Giving you medicine to help make your contractions stronger and more frequent.  Stage 1 ends when your cervix is completely dilated to 4 inches (10 cm) and completely effaced. This happens at the end of the transition phase. Stage 2  Once   your cervix is completely effaced and dilated to 4 inches (10 cm), you may start to feel an urge to push. It is common for the body to naturally take a rest before feeling the urge to push, especially if you received an epidural or certain other pain medicines. This rest period may last for up to 1-2 hours, depending on your unique labor experience.  During stage 2, contractions are generally less painful, because pushing helps relieve contraction pain. Instead of contraction pain, you may feel stretching and burning pain, especially when the widest part of your baby's head passes through the vaginal opening (crowning).  Your health care provider will closely monitor your pushing progress and your baby's progress through the vagina during stage 2.  Your  health care provider may massage the area of skin between your vaginal opening and anus (perineum) or apply warm compresses to your perineum. This helps it stretch as the baby's head starts to crown, which can help prevent perineal tearing. ? In some cases, an incision may be made in your perineum (episiotomy) to allow the baby to pass through the vaginal opening. An episiotomy helps to make the opening of the vagina larger to allow more room for the baby to fit through.  It is very important to breathe and focus so your health care provider can control the delivery of your baby's head. Your health care provider may have you decrease the intensity of your pushing, to help prevent perineal tearing.  After delivery of your baby's head, the shoulders and the rest of the body generally deliver very quickly and without difficulty.  Once your baby is delivered, the umbilical cord may be cut right away, or this may be delayed for 1-2 minutes, depending on your baby's health. This may vary among health care providers, hospitals, and birth centers.  If you and your baby are healthy enough, your baby may be placed on your chest or abdomen to help maintain the baby's temperature and to help you bond with each other. Some mothers and babies start breastfeeding at this time. Your health care team will dry your baby and help keep your baby warm during this time.  Your baby may need immediate care if he or she: ? Showed signs of distress during labor. ? Has a medical condition. ? Was born too early (prematurely). ? Had a bowel movement before birth (meconium). ? Shows signs of difficulty transitioning from being inside the uterus to being outside of the uterus. If you are planning to breastfeed, your health care team will help you begin a feeding. Stage 3  The third stage of labor starts immediately after the birth of your baby and ends after you deliver the placenta. The placenta is an organ that develops  during pregnancy to provide oxygen and nutrients to your baby in the womb.  Delivering the placenta may require some pushing, and you may have mild contractions. Breastfeeding can stimulate contractions to help you deliver the placenta.  After the placenta is delivered, your uterus should tighten (contract) and become firm. This helps to stop bleeding in your uterus. To help your uterus contract and to control bleeding, your health care provider may: ? Give you medicine by injection, through an IV tube, by mouth, or through your rectum (rectally). ? Massage your abdomen or perform a vaginal exam to remove any blood clots that are left in your uterus. ? Empty your bladder by placing a thin, flexible tube (catheter) into your bladder. ? Encourage   you to breastfeed your baby. After labor is over, you and your baby will be monitored closely to ensure that you are both healthy until you are ready to go home. Your health care team will teach you how to care for yourself and your baby. This information is not intended to replace advice given to you by your health care provider. Make sure you discuss any questions you have with your health care provider. Document Released: 10/28/2007 Document Revised: 08/08/2015 Document Reviewed: 02/02/2015 Elsevier Interactive Patient Education  2018 [employer]  

## 2017-03-18 NOTE — Progress Notes (Signed)
Pt is here for an [name] visit.Is due for cultures. 

## 2017-03-19 ENCOUNTER — Encounter: Payer: Self-pay | Admitting: Certified Nurse Midwife

## 2017-03-19 NOTE — Progress Notes (Signed)
[name]-Doing well, plans to contact [facility]" at 37 weeks for tub rental. 36 week cultures collected. Declines SVE. Reviewed red flag symptoms and when to call. RTC x 1 week for [name] or sooner if needed.

## 2017-03-20 LAB — STREP GP B NAA: STREP GROUP B AG: NEGATIVE

## 2017-03-22 LAB — GC/CHLAMYDIA PROBE AMP
Chlamydia trachomatis, NAA: NEGATIVE
Neisseria gonorrhoeae by PCR: NEGATIVE

## 2017-03-25 ENCOUNTER — Ambulatory Visit (INDEPENDENT_AMBULATORY_CARE_PROVIDER_SITE_OTHER): Payer: No Typology Code available for payment source | Admitting: Certified Nurse Midwife

## 2017-03-25 VITALS — BP 105/72 | HR 88 | Wt 176.2 lb

## 2017-03-25 DIAGNOSIS — Z3403 Encounter for supervision of normal first pregnancy, third trimester: Secondary | ICD-10-CM | POA: Diagnosis not present

## 2017-03-25 LAB — POCT URINALYSIS DIPSTICK
BILIRUBIN UA: NEGATIVE
Blood, UA: NEGATIVE
GLUCOSE UA: NEGATIVE
Ketones, UA: NEGATIVE
Leukocytes, UA: NEGATIVE
Nitrite, UA: NEGATIVE
Protein, UA: NEGATIVE
Spec Grav, UA: 1.02 (ref 1.010–1.025)
Urobilinogen, UA: 0.2 E.U./dL
pH, UA: 6 (ref 5.0–8.0)

## 2017-03-25 NOTE — Patient Instructions (Signed)
Braxton Hicks Contractions °Contractions of the uterus can occur throughout pregnancy, but they are not always a sign that you are in labor. You may have practice contractions called Braxton Hicks contractions. These false labor contractions are sometimes confused with true labor. °What are Braxton Hicks contractions? °Braxton Hicks contractions are tightening movements that occur in the muscles of the uterus before labor. Unlike true labor contractions, these contractions do not result in opening (dilation) and thinning of the cervix. Toward the end of pregnancy (32-34 weeks), Braxton Hicks contractions can happen more often and may become stronger. These contractions are sometimes difficult to tell apart from true labor because they can be very uncomfortable. You should not feel embarrassed if you go to the hospital with false labor. °Sometimes, the only way to tell if you are in true labor is for your health care provider to look for changes in the cervix. The health care provider will do a physical exam and may monitor your contractions. If you are not in true labor, the exam should show that your cervix is not dilating and your water has not broken. °If there are other health problems associated with your pregnancy, it is completely safe for you to be sent home with false labor. You may continue to have Braxton Hicks contractions until you go into true labor. °How to tell the difference between true labor and false labor °True labor °· Contractions last 30-70 seconds. °· Contractions become very regular. °· Discomfort is usually felt in the top of the uterus, and it spreads to the lower abdomen and low back. °· Contractions do not go away with walking. °· Contractions usually become more intense and increase in frequency. °· The cervix dilates and gets thinner. °False labor °· Contractions are usually shorter and not as strong as true labor contractions. °· Contractions are usually irregular. °· Contractions  are often felt in the front of the lower abdomen and in the groin. °· Contractions may go away when you walk around or change positions while lying down. °· Contractions get weaker and are shorter-lasting as time goes on. °· The cervix usually does not dilate or become thin. °Follow these instructions at home: °· Take over-the-counter and prescription medicines only as told by your health care provider. °· Keep up with your usual exercises and follow other instructions from your health care provider. °· Eat and drink lightly if you think you are going into labor. °· If Braxton Hicks contractions are making you uncomfortable: °? Change your position from lying down or resting to walking, or change from walking to resting. °? Sit and rest in a tub of warm water. °? Drink enough fluid to keep your urine pale yellow. Dehydration may cause these contractions. °? Do slow and deep breathing several times an hour. °· Keep all follow-up prenatal visits as told by your health care provider. This is important. °Contact a health care provider if: °· You have a fever. °· You have continuous pain in your abdomen. °Get help right away if: °· Your contractions become stronger, more regular, and closer together. °· You have fluid leaking or gushing from your vagina. °· You pass blood-tinged mucus (bloody show). °· You have bleeding from your vagina. °· You have low back pain that you never had before. °· You feel your baby’s head pushing down and causing pelvic pressure. °· Your baby is not moving inside you as much as it used to. °Summary °· Contractions that occur before labor are called Braxton   Hicks contractions, false labor, or practice contractions. °· Braxton Hicks contractions are usually shorter, weaker, farther apart, and less regular than true labor contractions. True labor contractions usually become progressively stronger and regular and they become more frequent. °· Manage discomfort from Braxton Hicks contractions by  changing position, resting in a warm bath, drinking plenty of water, or practicing deep breathing. °This information is not intended to replace advice given to you by your health care provider. Make sure you discuss any questions you have with your health care provider. °Document Released: 06/03/2016 Document Revised: 06/03/2016 Document Reviewed: 06/03/2016 °Elsevier Interactive Patient Education © 2018 [employer] ° °

## 2017-03-25 NOTE — Progress Notes (Signed)
[name], doing well. Feels good fetal movement. Has occasional contractions. Labor precautions reviewed. Follow up 1 wk.   Doreene BurkeAnnie Torey Reinard, CNM

## 2017-03-25 NOTE — Progress Notes (Signed)
Pt is here for an [name] visit. 

## 2017-03-31 ENCOUNTER — Ambulatory Visit (INDEPENDENT_AMBULATORY_CARE_PROVIDER_SITE_OTHER): Payer: No Typology Code available for payment source | Admitting: Certified Nurse Midwife

## 2017-03-31 ENCOUNTER — Telehealth: Payer: Self-pay | Admitting: Certified Nurse Midwife

## 2017-03-31 VITALS — BP 98/58 | HR 80 | Wt 178.2 lb

## 2017-03-31 DIAGNOSIS — Z3403 Encounter for supervision of normal first pregnancy, third trimester: Secondary | ICD-10-CM | POA: Diagnosis not present

## 2017-03-31 LAB — POCT URINALYSIS DIPSTICK
BILIRUBIN UA: NEGATIVE
Blood, UA: NEGATIVE
GLUCOSE UA: NEGATIVE
KETONES UA: NEGATIVE
Leukocytes, UA: NEGATIVE
Nitrite, UA: NEGATIVE
PH UA: 6.5 (ref 5.0–8.0)
Protein, UA: NEGATIVE
Spec Grav, UA: 1.025 (ref 1.010–1.025)
UROBILINOGEN UA: 0.2 U/dL

## 2017-03-31 NOTE — Telephone Encounter (Signed)
1710 Telephone call to patient, verified full name and date of birth.   Questions vaginal spotting after cervical exam and reports irregular contractions every 10-15 minutes.   Reviewed red flag symptoms and when to call. RTC as previously scheduled or sooner if needed.    Elizabeth Rush Michelle Kolbee Bogusz, CNM [facility]

## 2017-03-31 NOTE — Telephone Encounter (Signed)
Patient called stating she has a question regarding her appointment today and wanted to speak with you directly.

## 2017-03-31 NOTE — Progress Notes (Signed)
Pt is here for an [name] visit. States she has been contracting but nothing regular.

## 2017-03-31 NOTE — Patient Instructions (Signed)
Natural Childbirth Natural childbirth is going through labor and delivery without any drugs to relieve pain. Additionally, fetal monitors are not used, a delivery is not done, and a surgical cut to enlarge the vaginal opening (episiotomy) is not made. With the help of a birthing professional (midwife or health care provider), you direct your own labor and delivery. Many women choose natural childbirth because it makes them feel more in control and in touch with their labor and delivery. Some woman also choose natural childbirth because they are concerned about medicines affecting them and their babies. Pregnant women with a high-risk pregnancy should not attempt natural childbirth. It is better to deliver the infant in a hospital if an emergency situation arises. Sometimes, a health care provider has to get involved for the health and safety of the mother and infant. Techniques for natural childbirth  The Lamaze method-This method teaches parents that having a baby is normal, healthy, and natural. It also teaches the mother to take a neutral position regarding pain medicine and numbing medicines and to make an informed decision about using these medicines when the time comes.  The Bradley method (also called husband-coached birth)-This method teaches the father or partner to be the birth coach. It encourages the mother to exercise and eat a balanced, nutritious diet. It also involves relaxation and deep breathing exercises and preparing the parents for emergency situations. Methods of dealing with labor pain and delivery  Meditation.  Yoga.  Hypnosis.  Acupuncture.  Massage.  Changing positions (walking, rocking, showering, leaning on birth balls).  Lying in warm water or a whirlpool bath.  Finding an activity that keeps your mind off of the labor pain.  Listening to soft music.  Focusing on a particular object (visual imagery). Before going into labor  Be sure you and your spouse or  partner are in agreement about having a natural childbirth.  Decide if your health care provider or a midwife will deliver your baby.  Decide if you will have your baby in the hospital, at a birthing center, or at home.  If you have children, make plans to have someone take care of them when you go to the hospital or birthing center.  Know the distance and the time it takes to go to the hospital or birthing center. Practice going there and time it to be sure.  Have a bag packed with a nightgown, bathrobe, and toiletries. Be ready to take it with you when you go into labor.  Keep phone numbers of your family and friends handy if you need to call someone when you go into labor.  Your spouse or partner should go to all the natural childbirth technique classes.  Talk with your health care provider about the possibility of a medical emergency and what will happen if that occurs. Advantages of natural childbirth  You are in control of your labor and delivery.  You will not take medicines that could affect you and the baby.  There are no invasive procedures, such as an episiotomy.  You and your spouse or partner will work together, which can increase your bond with each other.  In most delivery centers, your family and friends can be involved in the labor and delivery process. Disadvantages of natural childbirth  You will experience pain during your labor and delivery.  The methods of helping relieve your labor pains may not work for you.  You may feel disappointed if you decide to change your mind during labor and not   have a natural childbirth. After the delivery  You will be very tired.  You will be uncomfortable because of your uterus contracting. You will feel soreness around the vagina.  You may feel cold and shaky. This is a natural reaction. This information is not intended to replace advice given to you by your health care provider. Make sure you discuss any questions you  have with your health care provider. Document Released: 01/01/2008 Document Revised: 06/26/2015 Document Reviewed: 09/25/2012 Elsevier Interactive Patient Education  2017 Elsevier Inc.  

## 2017-04-03 ENCOUNTER — Observation Stay
Admission: EM | Admit: 2017-04-03 | Discharge: 2017-04-03 | Disposition: A | Discharge status: Home / Self Care | Payer: BLUE CROSS/BLUE SHIELD | Attending: Certified Nurse Midwife | Admitting: Certified Nurse Midwife

## 2017-04-03 ENCOUNTER — Encounter: Payer: Self-pay | Admitting: *Deleted

## 2017-04-03 ENCOUNTER — Other Ambulatory Visit: Payer: Self-pay

## 2017-04-03 DIAGNOSIS — Z3A38 38 weeks gestation of pregnancy: Secondary | ICD-10-CM | POA: Insufficient documentation

## 2017-04-03 DIAGNOSIS — O471 False labor at or after 37 completed weeks of gestation: Principal | ICD-10-CM | POA: Insufficient documentation

## 2017-04-03 HISTORY — DX: Other specified health status: Z78.9

## 2017-04-03 NOTE — Discharge Instructions (Signed)
Keep your next scheduled visit.  If you have questions or concerns you may call the on call provider.  You may also call the nurse's desk at the [facility] at [phone] for questions.  If you have urgent concerns please go to the nearest emergency department for evaluation.

## 2017-04-03 NOTE — OB Triage Note (Addendum)
Patient states that contractions began Friday night and continue through today.  They have ranged from 3-20 minutes apart.  She rates pain 3/10 on pain scale. She reports no change in discharge, no LOF and no bleeding.

## 2017-04-03 NOTE — Discharge Summary (Signed)
Physician Obstetric Discharge Summary  Patient ID: Elizabeth Rush MRN: [id number] DOB/AGE: 22/09/1995 22 y.o.   Date of Admission: 04/03/2017  Date of Discharge: 04/03/2017  Admitting Diagnosis: Observation at 7344w5d  Secondary Diagnosis: None     Discharge Diagnosis: No other diagnosis   Antepartum Procedures: NST   Brief Hospital Course   L&D OB Triage Note  Elizabeth Rush is a 22 y.o. G2P1001 female at 444w5d, EDD Estimated Date of Delivery: 04/12/17 who presented to triage for complaints of abdominal tightness and urine contractions.  She was evaluated by the nurses with no significant findings fetal distress or labor. Vital signs stable. An NST was performed and has been reviewed by CNM.   NST INTERPRETATION: Indications: rule out uterine contractions  Mode: External Baseline Rate (A): 130 bpm Variability: Moderate Accelerations: 15 x 15 Decelerations: None     Contraction Frequency (min): 1.5-3  Impression: reactive  Dilation: 2 Effacement (%): 50 Cervical Position: Middle Station: -3 Presentation: Vertex Exam by:: LSE  Plan: NST performed was reviewed and was found to be reactive. She was discharged home with bleeding/labor precautions.  Continue routine prenatal care. Follow up with CNM as previously scheduled.   Discharge Instructions: Per After Visit Summary.  Activity: Also refer to After Visit Summary  Diet: Regular  Medications: Allergies as of 04/03/2017      Reactions   Corn Syrup [glucose] Hives, Diarrhea, Nausea And Vomiting      Medication List    TAKE these medications   folic acid 1 MG tablet Commonly known as:  FOLVITE Take 1 mg by mouth daily.   WOMENS MULTI VITAMIN & MINERAL PO Take by mouth.      Outpatient follow up: As previously scheduled  Discharged Condition: stable  Discharged to: home   Serafina RoyalsMichelle Lawhorn, CNM  [facility], [facility]

## 2017-04-05 NOTE — Progress Notes (Signed)
[name]-Doing well, reports irregular contractions. Discussed home labor preparation techniques. Sample packing list given. Reviewed red flag symptoms and when to call. RTC x 1 week for [name] or sooner if needed.

## 2017-04-08 ENCOUNTER — Other Ambulatory Visit: Payer: Self-pay | Admitting: Certified Nurse Midwife

## 2017-04-08 ENCOUNTER — Other Ambulatory Visit: Payer: Self-pay

## 2017-04-08 ENCOUNTER — Encounter: Payer: Self-pay | Admitting: Certified Nurse Midwife

## 2017-04-08 ENCOUNTER — Inpatient Hospital Stay
Admission: EM | Admit: 2017-04-08 | Discharge: 2017-04-09 | DRG: 833 | Disposition: A | Discharge status: Home / Self Care | Payer: BLUE CROSS/BLUE SHIELD | Attending: Certified Nurse Midwife | Admitting: Certified Nurse Midwife

## 2017-04-08 ENCOUNTER — Ambulatory Visit (INDEPENDENT_AMBULATORY_CARE_PROVIDER_SITE_OTHER): Payer: No Typology Code available for payment source | Admitting: Certified Nurse Midwife

## 2017-04-08 VITALS — BP 100/73 | HR 91 | Wt 176.6 lb

## 2017-04-08 DIAGNOSIS — Z3A40 40 weeks gestation of pregnancy: Secondary | ICD-10-CM

## 2017-04-08 DIAGNOSIS — Z3403 Encounter for supervision of normal first pregnancy, third trimester: Secondary | ICD-10-CM

## 2017-04-08 DIAGNOSIS — Z3483 Encounter for supervision of other normal pregnancy, third trimester: Secondary | ICD-10-CM | POA: Diagnosis present

## 2017-04-08 DIAGNOSIS — O480 Post-term pregnancy: Secondary | ICD-10-CM

## 2017-04-08 DIAGNOSIS — Z3A39 39 weeks gestation of pregnancy: Secondary | ICD-10-CM

## 2017-04-08 LAB — CBC
HCT: 39.1 % (ref 35.0–47.0)
Hemoglobin: 13.7 g/dL (ref 12.0–16.0)
MCH: 29.5 pg (ref 26.0–34.0)
MCHC: 35.1 g/dL (ref 32.0–36.0)
MCV: 84 fL (ref 80.0–100.0)
PLATELETS: 250 10*3/uL (ref 150–440)
RBC: 4.66 MIL/uL (ref 3.80–5.20)
RDW: 13.4 % (ref 11.5–14.5)
WBC: 16.1 10*3/uL — ABNORMAL HIGH (ref 3.6–11.0)

## 2017-04-08 LAB — POCT URINALYSIS DIPSTICK
Bilirubin, UA: NEGATIVE
Glucose, UA: NEGATIVE
Ketones, UA: NEGATIVE
LEUKOCYTES UA: NEGATIVE
NITRITE UA: NEGATIVE
PROTEIN UA: NEGATIVE
RBC UA: NEGATIVE
SPEC GRAV UA: 1.015 (ref 1.010–1.025)
Urobilinogen, UA: 0.2 E.U./dL
pH, UA: 6.5 (ref 5.0–8.0)

## 2017-04-08 LAB — TYPE AND SCREEN
ABO/RH(D): O POS
ANTIBODY SCREEN: NEGATIVE

## 2017-04-08 MED ORDER — ACETAMINOPHEN 325 MG PO TABS
650.0000 mg | ORAL_TABLET | ORAL | Status: DC | PRN
  Filled 2017-04-08: qty 2

## 2017-04-08 MED ORDER — LIDOCAINE HCL (PF) 1 % IJ SOLN: 30.0000 mL | INTRAMUSCULAR | Status: DC | PRN

## 2017-04-08 MED ORDER — METHYLERGONOVINE MALEATE 0.2 MG/ML IJ SOLN: 0.2000 mg | INTRAMUSCULAR | Status: DC | PRN

## 2017-04-08 MED ORDER — ONDANSETRON HCL 4 MG PO TABS: 4.0000 mg | ORAL_TABLET | ORAL | Status: DC | PRN

## 2017-04-08 MED ORDER — IBUPROFEN 600 MG PO TABS
ORAL_TABLET | ORAL | Status: AC
  Administered 2017-04-08: 600 mg
  Filled 2017-04-08: qty 1

## 2017-04-08 MED ORDER — SIMETHICONE 80 MG PO CHEW: 80.0000 mg | CHEWABLE_TABLET | ORAL | Status: DC | PRN

## 2017-04-08 MED ORDER — OXYTOCIN 10 UNIT/ML IJ SOLN
INTRAMUSCULAR | Status: AC
  Filled 2017-04-08: qty 2

## 2017-04-08 MED ORDER — COCONUT OIL OIL: 1.0000 "application " | TOPICAL_OIL | Status: DC | PRN

## 2017-04-08 MED ORDER — OXYTOCIN BOLUS FROM INFUSION
500.0000 mL | Freq: Once | INTRAVENOUS | Status: AC
  Administered 2017-04-08: 500 mL via INTRAVENOUS

## 2017-04-08 MED ORDER — AMMONIA AROMATIC IN INHA
RESPIRATORY_TRACT | Status: AC
  Filled 2017-04-08: qty 10

## 2017-04-08 MED ORDER — OXYTOCIN 40 UNITS IN LACTATED RINGERS INFUSION - SIMPLE MED
INTRAVENOUS | Status: AC
  Filled 2017-04-08: qty 1000

## 2017-04-08 MED ORDER — TETANUS-DIPHTH-ACELL PERTUSSIS 5-2.5-18.5 LF-MCG/0.5 IM SUSP: 0.5000 mL | Freq: Once | INTRAMUSCULAR | Status: DC

## 2017-04-08 MED ORDER — METHYLERGONOVINE MALEATE 0.2 MG PO TABS: 0.2000 mg | ORAL_TABLET | ORAL | Status: DC | PRN

## 2017-04-08 MED ORDER — OXYCODONE-ACETAMINOPHEN 5-325 MG PO TABS: 1.0000 | ORAL_TABLET | ORAL | Status: DC | PRN

## 2017-04-08 MED ORDER — IBUPROFEN 600 MG PO TABS
600.0000 mg | ORAL_TABLET | Freq: Four times a day (QID) | ORAL | Status: DC
  Filled 2017-04-08: qty 1

## 2017-04-08 MED ORDER — SODIUM CHLORIDE 0.9 % IV SOLN: INTRAVENOUS | Status: DC

## 2017-04-08 MED ORDER — ACETAMINOPHEN 325 MG PO TABS
650.0000 mg | ORAL_TABLET | ORAL | Status: DC | PRN
  Administered 2017-04-08 – 2017-04-09 (×5): 650 mg via ORAL
  Filled 2017-04-08 (×5): qty 2

## 2017-04-08 MED ORDER — OXYTOCIN 10 UNIT/ML IJ SOLN: 10.0000 [IU] | Freq: Once | INTRAMUSCULAR | Status: DC

## 2017-04-08 MED ORDER — OXYTOCIN 40 UNITS IN LACTATED RINGERS INFUSION - SIMPLE MED
2.5000 [IU]/h | INTRAVENOUS | Status: DC
  Administered 2017-04-08: 2.5 [IU]/h via INTRAVENOUS

## 2017-04-08 MED ORDER — LIDOCAINE HCL (PF) 1 % IJ SOLN
INTRAMUSCULAR | Status: AC
  Filled 2017-04-08: qty 30

## 2017-04-08 MED ORDER — DIBUCAINE 1 % RE OINT: 1.0000 "application " | TOPICAL_OINTMENT | RECTAL | Status: DC | PRN

## 2017-04-08 MED ORDER — ONDANSETRON HCL 4 MG/2ML IJ SOLN: 4.0000 mg | Freq: Four times a day (QID) | INTRAMUSCULAR | Status: DC | PRN

## 2017-04-08 MED ORDER — SENNOSIDES-DOCUSATE SODIUM 8.6-50 MG PO TABS: 2.0000 | ORAL_TABLET | ORAL | Status: DC

## 2017-04-08 MED ORDER — OXYCODONE-ACETAMINOPHEN 5-325 MG PO TABS: 2.0000 | ORAL_TABLET | ORAL | Status: DC | PRN

## 2017-04-08 MED ORDER — WITCH HAZEL-GLYCERIN EX PADS: 1.0000 "application " | MEDICATED_PAD | CUTANEOUS | Status: DC | PRN

## 2017-04-08 MED ORDER — MISOPROSTOL 200 MCG PO TABS
ORAL_TABLET | ORAL | Status: AC
  Filled 2017-04-08: qty 4

## 2017-04-08 MED ORDER — SOD CITRATE-CITRIC ACID 500-334 MG/5ML PO SOLN: 30.0000 mL | ORAL | Status: DC | PRN

## 2017-04-08 MED ORDER — BUTORPHANOL TARTRATE 1 MG/ML IJ SOLN: 1.0000 mg | INTRAMUSCULAR | Status: DC | PRN

## 2017-04-08 MED ORDER — LACTATED RINGERS IV SOLN: 500.0000 mL | INTRAVENOUS | Status: DC | PRN

## 2017-04-08 MED ORDER — PRENATAL MULTIVITAMIN CH
1.0000 | ORAL_TABLET | Freq: Every day | ORAL | Status: DC
  Administered 2017-04-09: 1 via ORAL
  Filled 2017-04-08: qty 1

## 2017-04-08 MED ORDER — BENZOCAINE-MENTHOL 20-0.5 % EX AERO: 1.0000 "application " | INHALATION_SPRAY | CUTANEOUS | Status: DC | PRN

## 2017-04-08 MED ORDER — FERROUS SULFATE 325 (65 FE) MG PO TABS
325.0000 mg | ORAL_TABLET | Freq: Every day | ORAL | Status: DC
  Administered 2017-04-09: 325 mg via ORAL
  Filled 2017-04-08: qty 1

## 2017-04-08 MED ORDER — LACTATED RINGERS IV SOLN: INTRAVENOUS | Status: DC

## 2017-04-08 MED ORDER — IBUPROFEN 600 MG PO TABS: 600.0000 mg | ORAL_TABLET | Freq: Four times a day (QID) | ORAL | Status: DC

## 2017-04-08 MED ORDER — DOCUSATE SODIUM 100 MG PO CAPS
100.0000 mg | ORAL_CAPSULE | Freq: Two times a day (BID) | ORAL | Status: DC
  Administered 2017-04-09: 100 mg via ORAL
  Filled 2017-04-08: qty 1

## 2017-04-08 MED ORDER — ONDANSETRON HCL 4 MG/2ML IJ SOLN: 4.0000 mg | INTRAMUSCULAR | Status: DC | PRN

## 2017-04-08 NOTE — Plan of Care (Signed)
Vs stable; to mother/baby at 2015; tolerating regular diet; has had motrin and tylenol for pain and requests tylenol again when it's available; will breast and formula feed baby

## 2017-04-08 NOTE — Progress Notes (Signed)
Pt is here for an [name] visit. Is having contractions.

## 2017-04-08 NOTE — Progress Notes (Signed)
[name], doing well. Feels good movement. Having contractions about 5 min . Apart. SVE 3/70/-2. Membranes felt. Reviewed labor precautions. Follow up 1 wks with BPP and [name].   Elizabeth BurkeAnnie Hildur Rush, CNM

## 2017-04-08 NOTE — Progress Notes (Signed)
History and Physical   HPI  Elizabeth Rush is a 22 y.o. G2P1001 at [redacted]w[redacted]d Estimated Date of Delivery: 04/12/17 who is being admitted for labor management   OB History  Obstetric History   G2   P1   T1   P0   A0   L1    SAB0   TAB0   Ectopic0   Multiple0   Live Births1     # Outcome Date GA Lbr Len/2nd Weight Sex Delivery Anes PTL Lv  2 Current           1 Term 2014 [redacted]w[redacted]d  6 lb (2.722 kg) F Vag-Spont  N LIV      PROBLEM LIST  Pregnancy complications or risks: Patient Active Problem List   Diagnosis Date Noted  . Labor and delivery, indication for care 04/08/2017  . Indication for care in labor or delivery 04/03/2017    Prenatal labs and studies: ABO, Rh: O/Positive/-- (08/29 1438) Antibody: Negative (08/29 1438) Rubella: 2.85 (08/29 1438) RPR: Non Reactive (12/17 1033)  HBsAg: Negative (08/29 1438)  HIV: Non Reactive (08/29 1438)  ZOX:WRUEAVWU (02/15 1650)   Past Medical History:  Diagnosis Date  . Medical history non-contributory   . Seasonal affective disorder [facility])      Past Surgical History:  Procedure Laterality Date  . NO PAST SURGERIES       Medications    Current Discharge Medication List    CONTINUE these medications which have NOT CHANGED   Details  folic acid (FOLVITE) 1 MG tablet Take 1 mg by mouth daily.    Multiple Vitamins-Minerals (WOMENS MULTI VITAMIN & MINERAL PO) Take by mouth.         Allergies  Corn syrup [glucose]  Review of Systems  Constitutional: negative Eyes: negative Ears, nose, mouth, throat, and face: negative Respiratory: negative Cardiovascular: negative Gastrointestinal: negative Genitourinary:negative Integument/breast: negative Hematologic/lymphatic: negative Musculoskeletal:negative Neurological: negative Behavioral/Psych: negative Endocrine: negative Allergic/Immunologic: negative  Physical Exam  Temp 97.9 F (36.6 C) (Oral)   Resp 18   Ht 5\' 6"  (1.676 m)   Wt 176 lb (79.8 kg)   LMP  07/06/2016 (Exact Date)   BMI 28.41 kg/m   Lungs:  CTA B Cardio: RRR  Abd: Soft, gravid, NT Presentation: cephalic EXT: No C/C/ 1+ Edema CERVIX: not evaluated  : Per RN exam 7cm, pt declines exam at this time.  See Prenatal records for more detailed PE.     FHR:  Baseline: 120 bpm, Variability: Good {> 6 bpm), Accelerations: Reactive and Decelerations: Early  Toco: Uterine Contractions: Frequency: Every 2-3 minutes, Duration: 60-90 seconds and Intensity: strong   Test Results  Results for orders placed or performed in visit on 04/08/17 (from the past 24 hour(s))  POCT urinalysis dipstick     Status: None   Collection Time: 04/08/17 11:29 AM  Result Value Ref Range   Color, UA yellow    Clarity, UA clear    Glucose, UA neg    Bilirubin, UA neg    Ketones, UA neg    Spec Grav, UA 1.015 1.010 - 1.025   Blood, UA neg    pH, UA 6.5 5.0 - 8.0   Protein, UA neg    Urobilinogen, UA 0.2 0.2 or 1.0 E.U./dL   Nitrite, UA neg    Leukocytes, UA Negative Negative   Appearance     Odor     Group B Strep negative  Assessment   G2P1001 at [redacted]w[redacted]d Estimated Date of Delivery: 04/12/17  The fetus is reassuring.   Patient Active Problem List   Diagnosis Date Noted  . Labor and delivery, indication for care 04/08/2017  . Indication for care in labor or delivery 04/03/2017    Plan  1. Admit to L&D :   continue present management 2. EFM:-- Category 1 3. Epidural if desired. Stadol for IV pain until epidural requested. 4. Admission labs  5. Anticipate NSVD  Doreene Burkennie Merrin Mcvicker, CNM  04/08/2017 5:07 PM

## 2017-04-08 NOTE — Patient Instructions (Signed)
Braxton Hicks Contractions °Contractions of the uterus can occur throughout pregnancy, but they are not always a sign that you are in labor. You may have practice contractions called Braxton Hicks contractions. These false labor contractions are sometimes confused with true labor. °What are Braxton Hicks contractions? °Braxton Hicks contractions are tightening movements that occur in the muscles of the uterus before labor. Unlike true labor contractions, these contractions do not result in opening (dilation) and thinning of the cervix. Toward the end of pregnancy (32-34 weeks), Braxton Hicks contractions can happen more often and may become stronger. These contractions are sometimes difficult to tell apart from true labor because they can be very uncomfortable. You should not feel embarrassed if you go to the hospital with false labor. °Sometimes, the only way to tell if you are in true labor is for your health care provider to look for changes in the cervix. The health care provider will do a physical exam and may monitor your contractions. If you are not in true labor, the exam should show that your cervix is not dilating and your water has not broken. °If there are other health problems associated with your pregnancy, it is completely safe for you to be sent home with false labor. You may continue to have Braxton Hicks contractions until you go into true labor. °How to tell the difference between true labor and false labor °True labor °· Contractions last 30-70 seconds. °· Contractions become very regular. °· Discomfort is usually felt in the top of the uterus, and it spreads to the lower abdomen and low back. °· Contractions do not go away with walking. °· Contractions usually become more intense and increase in frequency. °· The cervix dilates and gets thinner. °False labor °· Contractions are usually shorter and not as strong as true labor contractions. °· Contractions are usually irregular. °· Contractions  are often felt in the front of the lower abdomen and in the groin. °· Contractions may go away when you walk around or change positions while lying down. °· Contractions get weaker and are shorter-lasting as time goes on. °· The cervix usually does not dilate or become thin. °Follow these instructions at home: °· Take over-the-counter and prescription medicines only as told by your health care provider. °· Keep up with your usual exercises and follow other instructions from your health care provider. °· Eat and drink lightly if you think you are going into labor. °· If Braxton Hicks contractions are making you uncomfortable: °? Change your position from lying down or resting to walking, or change from walking to resting. °? Sit and rest in a tub of warm water. °? Drink enough fluid to keep your urine pale yellow. Dehydration may cause these contractions. °? Do slow and deep breathing several times an hour. °· Keep all follow-up prenatal visits as told by your health care provider. This is important. °Contact a health care provider if: °· You have a fever. °· You have continuous pain in your abdomen. °Get help right away if: °· Your contractions become stronger, more regular, and closer together. °· You have fluid leaking or gushing from your vagina. °· You pass blood-tinged mucus (bloody show). °· You have bleeding from your vagina. °· You have low back pain that you never had before. °· You feel your baby’s head pushing down and causing pelvic pressure. °· Your baby is not moving inside you as much as it used to. °Summary °· Contractions that occur before labor are called Braxton   Hicks contractions, false labor, or practice contractions. °· Braxton Hicks contractions are usually shorter, weaker, farther apart, and less regular than true labor contractions. True labor contractions usually become progressively stronger and regular and they become more frequent. °· Manage discomfort from Braxton Hicks contractions by  changing position, resting in a warm bath, drinking plenty of water, or practicing deep breathing. °This information is not intended to replace advice given to you by your health care provider. Make sure you discuss any questions you have with your health care provider. °Document Released: 06/03/2016 Document Revised: 06/03/2016 Document Reviewed: 06/03/2016 °Elsevier Interactive Patient Education © 2018 [employer] ° °

## 2017-04-08 NOTE — OB Triage Note (Signed)
Pt was seen at [facility] today at 11:15 am. Pt states that "her membranes were stripped" and that her ctx progressively gotten worst since then. Pt rates her pain a 7/10. Pt reports positive fetal movement and denies vaginal bleeding and LOF. Monitors applied and assessing.

## 2017-04-09 LAB — CBC
HCT: 36.9 % (ref 35.0–47.0)
Hemoglobin: 12.6 g/dL (ref 12.0–16.0)
MCH: 29.2 pg (ref 26.0–34.0)
MCHC: 34.2 g/dL (ref 32.0–36.0)
MCV: 85.3 fL (ref 80.0–100.0)
PLATELETS: 197 10*3/uL (ref 150–440)
RBC: 4.33 MIL/uL (ref 3.80–5.20)
RDW: 13.2 % (ref 11.5–14.5)
WBC: 12 10*3/uL — AB (ref 3.6–11.0)

## 2017-04-09 LAB — RPR: RPR Ser Ql: NONREACTIVE

## 2017-04-09 NOTE — Final Progress Note (Signed)
Discharge Summary  Date of Admission: 04/08/2017  Date of Discharge: 04/09/2017  Admitting Diagnosis: Onset of Labor at 8778w3d  Mode of Delivery: normal spontaneous vaginal delivery                 Discharge Diagnosis: No other diagnosis   Intrapartum Procedures: none   Post partum procedures: none  Complications: nuchal cord                       Discharge Day SOAP Note:  Progress Note - Vaginal Delivery  Elizabeth PyleKimberly Rush is a 22 y.o. G2P2002 now PP day 1 s/p Vaginal, Spontaneous . Delivery was uncomplicated  Subjective  The patient has the following complaints: has no unusual complaints  Pain is controlled with current medications.   Patient is urinating without difficulty.  She is ambulating well.    Objective  Vital signs: BP 118/67 (BP Location: Right Arm)   Pulse 74   Temp 97.9 F (36.6 C) (Oral)   Resp 17   Ht 5\' 6"  (1.676 m)   Wt 176 lb (79.8 kg)   LMP 07/06/2016 (Exact Date)   SpO2 100%   Breastfeeding? Unknown   BMI 28.41 kg/m   Physical Exam: Gen: NAD Fundus Fundal Tone: Firm  Lochia Amount: Small  Perineum Appearance: Intact     Data Review Labs: CBC Latest Ref Rng & Units 04/09/2017 04/08/2017 01/17/2017  WBC 3.6 - 11.0 K/uL 12.0(H) 16.1(H) 8.0  Hemoglobin 12.0 - 16.0 g/dL 16.112.6 09.613.7 04.511.9  Hematocrit 35.0 - 47.0 % 36.9 39.1 34.9  Platelets 150 - 440 K/uL 197 250 236   O POS  Assessment/Plan  Active Problems:   Labor and delivery, indication for care    Plan for discharge today.   Discharge Instructions: Per After Visit Summary. Activity: Advance as tolerated. Pelvic rest for 6 weeks.  Also refer to After Visit Summary Diet: Regular Medications: Allergies as of 04/09/2017      Reactions   Corn Syrup [glucose] Hives, Diarrhea, Nausea And Vomiting      Medication List    TAKE these medications   folic acid 1 MG tablet Commonly known as:  FOLVITE Take 1 mg by mouth daily.   WOMENS MULTI VITAMIN & MINERAL PO Take by mouth.       Outpatient follow up:  Postpartum contraception: She is unsure at this time. We will discuss at 6 wk  office visit post-partum  Discharged Condition: good  Discharged to: home  Newborn Data: Disposition:home with mother  Apgars: APGAR (1 MIN): 8   APGAR (5 MINS): 9   APGAR (10 MINS):    Baby Feeding: Breast and pumping    Elizabeth Rush, CNM  04/09/2017 4:39 PM

## 2017-04-09 NOTE — Discharge Summary (Signed)
Discharge Summary  Date of Admission: 04/08/2017  Date of Discharge: 04/09/2017  Admitting Diagnosis: Onset of Labor at [redacted]w[redacted]d  Mode of Delivery: normal spontaneous vaginal delivery                 Discharge Diagnosis: No other diagnosis   Intrapartum Procedures: none   Post partum procedures: none  Complications: nuchal cord                       Discharge Day SOAP Note:  Progress Note - Vaginal Delivery  Elizabeth Rush is a 22 y.o. G2P2002 now PP day 1 s/p Vaginal, Spontaneous . Delivery was uncomplicated  Subjective  The patient has the following complaints: has no unusual complaints  Pain is controlled with current medications.   Patient is urinating without difficulty.  She is ambulating well.    Objective  Vital signs: BP 118/67 (BP Location: Right Arm)   Pulse 74   Temp 97.9 F (36.6 C) (Oral)   Resp 17   Ht 5' 6" (1.676 m)   Wt 176 lb (79.8 kg)   LMP 07/06/2016 (Exact Date)   SpO2 100%   Breastfeeding? Unknown   BMI 28.41 kg/m   Physical Exam: Gen: NAD Fundus Fundal Tone: Firm  Lochia Amount: Small  Perineum Appearance: Intact     Data Review Labs: CBC Latest Ref Rng & Units 04/09/2017 04/08/2017 01/17/2017  WBC 3.6 - 11.0 K/uL 12.0(H) 16.1(H) 8.0  Hemoglobin 12.0 - 16.0 g/dL 12.6 13.7 11.9  Hematocrit 35.0 - 47.0 % 36.9 39.1 34.9  Platelets 150 - 440 K/uL 197 250 236   O POS  Assessment/Plan  Active Problems:   Labor and delivery, indication for care    Plan for discharge today.   Discharge Instructions: Per After Visit Summary. Activity: Advance as tolerated. Pelvic rest for 6 weeks.  Also refer to After Visit Summary Diet: Regular Medications: Allergies as of 04/09/2017      Reactions   Corn Syrup [glucose] Hives, Diarrhea, Nausea And Vomiting      Medication List    TAKE these medications   folic acid 1 MG tablet Commonly known as:  FOLVITE Take 1 mg by mouth daily.   WOMENS MULTI VITAMIN & MINERAL PO Take by mouth.       Outpatient follow up:  Postpartum contraception: She is unsure at this time. We will discuss at 6 wk  office visit post-partum  Discharged Condition: good  Discharged to: home  Newborn Data: Disposition:home with mother  Apgars: APGAR (1 MIN): 8   APGAR (5 MINS): 9   APGAR (10 MINS):    Baby Feeding: Breast and pumping    Jermond Burkemper, CNM  04/09/2017 4:39 PM 

## 2017-04-09 NOTE — Progress Notes (Signed)
Patient discharged home with infant. Discharge instructions, prescriptions and follow up appointment given to and reviewed with patient. Patient verbalized understanding. Patient wheeled out with infant by RN 

## 2017-04-15 ENCOUNTER — Encounter: Payer: No Typology Code available for payment source | Admitting: Certified Nurse Midwife

## 2017-04-15 ENCOUNTER — Other Ambulatory Visit: Payer: No Typology Code available for payment source

## 2017-04-29 ENCOUNTER — Encounter: Payer: No Typology Code available for payment source | Admitting: Certified Nurse Midwife

## 2017-05-15 ENCOUNTER — Other Ambulatory Visit: Payer: Self-pay

## 2017-05-15 ENCOUNTER — Emergency Department: Payer: BLUE CROSS/BLUE SHIELD

## 2017-05-15 ENCOUNTER — Encounter: Payer: Self-pay | Admitting: Emergency Medicine

## 2017-05-15 ENCOUNTER — Inpatient Hospital Stay
Admission: EM | Admit: 2017-05-15 | Discharge: 2017-05-18 | DRG: 419 | Disposition: A | Discharge status: Home / Self Care | Payer: BLUE CROSS/BLUE SHIELD | Attending: Surgery | Admitting: Surgery

## 2017-05-15 DIAGNOSIS — K8050 Calculus of bile duct without cholangitis or cholecystitis without obstruction: Secondary | ICD-10-CM | POA: Diagnosis not present

## 2017-05-15 DIAGNOSIS — K8071 Calculus of gallbladder and bile duct without cholecystitis with obstruction: Secondary | ICD-10-CM

## 2017-05-15 DIAGNOSIS — K8070 Calculus of gallbladder and bile duct without cholecystitis without obstruction: Secondary | ICD-10-CM | POA: Diagnosis present

## 2017-05-15 DIAGNOSIS — Z91018 Allergy to other foods: Secondary | ICD-10-CM | POA: Diagnosis not present

## 2017-05-15 DIAGNOSIS — K8020 Calculus of gallbladder without cholecystitis without obstruction: Secondary | ICD-10-CM | POA: Diagnosis not present

## 2017-05-15 DIAGNOSIS — R109 Unspecified abdominal pain: Secondary | ICD-10-CM

## 2017-05-15 DIAGNOSIS — R1011 Right upper quadrant pain: Secondary | ICD-10-CM | POA: Diagnosis present

## 2017-05-15 LAB — COMPREHENSIVE METABOLIC PANEL
ALT: 574 U/L — ABNORMAL HIGH (ref 14–54)
AST: 412 U/L — ABNORMAL HIGH (ref 15–41)
Albumin: 4.7 g/dL (ref 3.5–5.0)
Alkaline Phosphatase: 250 U/L — ABNORMAL HIGH (ref 38–126)
Anion gap: 7 (ref 5–15)
BILIRUBIN TOTAL: 4.1 mg/dL — AB (ref 0.3–1.2)
BUN: 11 mg/dL (ref 6–20)
CHLORIDE: 101 mmol/L (ref 101–111)
CO2: 29 mmol/L (ref 22–32)
CREATININE: 0.58 mg/dL (ref 0.44–1.00)
Calcium: 9.2 mg/dL (ref 8.9–10.3)
Glucose, Bld: 114 mg/dL — ABNORMAL HIGH (ref 65–99)
POTASSIUM: 3.7 mmol/L (ref 3.5–5.1)
Sodium: 137 mmol/L (ref 135–145)
TOTAL PROTEIN: 7.6 g/dL (ref 6.5–8.1)

## 2017-05-15 LAB — CBC
HEMATOCRIT: 43.6 % (ref 35.0–47.0)
Hemoglobin: 14.9 g/dL (ref 12.0–16.0)
MCH: 29.1 pg (ref 26.0–34.0)
MCHC: 34.2 g/dL (ref 32.0–36.0)
MCV: 85 fL (ref 80.0–100.0)
PLATELETS: 255 10*3/uL (ref 150–440)
RBC: 5.13 MIL/uL (ref 3.80–5.20)
RDW: 12.8 % (ref 11.5–14.5)
WBC: 6.3 10*3/uL (ref 3.6–11.0)

## 2017-05-15 LAB — POCT PREGNANCY, URINE: Preg Test, Ur: NEGATIVE

## 2017-05-15 LAB — URINALYSIS, COMPLETE (UACMP) WITH MICROSCOPIC
BILIRUBIN URINE: NEGATIVE
GLUCOSE, UA: NEGATIVE mg/dL
Hgb urine dipstick: NEGATIVE
KETONES UR: NEGATIVE mg/dL
NITRITE: NEGATIVE
PH: 8 (ref 5.0–8.0)
PROTEIN: NEGATIVE mg/dL
Specific Gravity, Urine: 1.004 — ABNORMAL LOW (ref 1.005–1.030)

## 2017-05-15 LAB — LIPASE, BLOOD: LIPASE: 31 U/L (ref 11–51)

## 2017-05-15 MED ORDER — POLYETHYLENE GLYCOL 3350 17 G PO PACK: 17.0000 g | PACK | Freq: Every day | ORAL | Status: DC | PRN

## 2017-05-15 MED ORDER — ONDANSETRON HCL 4 MG/2ML IJ SOLN: 4.0000 mg | Freq: Four times a day (QID) | INTRAMUSCULAR | Status: DC | PRN

## 2017-05-15 MED ORDER — ACETAMINOPHEN 325 MG PO TABS: 650.0000 mg | ORAL_TABLET | Freq: Four times a day (QID) | ORAL | Status: DC | PRN

## 2017-05-15 MED ORDER — SODIUM CHLORIDE 0.9 % IV SOLN
INTRAVENOUS | Status: DC
  Administered 2017-05-15 – 2017-05-18 (×6): via INTRAVENOUS

## 2017-05-15 MED ORDER — ONDANSETRON 4 MG PO TBDP
4.0000 mg | ORAL_TABLET | Freq: Once | ORAL | Status: AC
  Administered 2017-05-15: 4 mg via ORAL
  Filled 2017-05-15: qty 1

## 2017-05-15 MED ORDER — ACETAMINOPHEN 650 MG RE SUPP: 650.0000 mg | Freq: Four times a day (QID) | RECTAL | Status: DC | PRN

## 2017-05-15 MED ORDER — ENOXAPARIN SODIUM 40 MG/0.4ML ~~LOC~~ SOLN
40.0000 mg | SUBCUTANEOUS | Status: DC
  Filled 2017-05-15: qty 0.4

## 2017-05-15 MED ORDER — ONDANSETRON HCL 4 MG PO TABS: 4.0000 mg | ORAL_TABLET | Freq: Four times a day (QID) | ORAL | Status: DC | PRN

## 2017-05-15 MED ORDER — OXYCODONE-ACETAMINOPHEN 5-325 MG PO TABS
2.0000 | ORAL_TABLET | Freq: Once | ORAL | Status: AC
  Administered 2017-05-15: 2 via ORAL
  Filled 2017-05-15: qty 2

## 2017-05-15 MED ORDER — HYDROCODONE-ACETAMINOPHEN 5-325 MG PO TABS
1.0000 | ORAL_TABLET | ORAL | Status: DC | PRN
  Administered 2017-05-18: 1 via ORAL
  Administered 2017-05-18 (×2): 2 via ORAL
  Filled 2017-05-15: qty 2
  Filled 2017-05-15: qty 1

## 2017-05-15 NOTE — ED Notes (Signed)
First Nurse Note:  [facility]  Patient is complaining of RUQ x 2 days.  Patient reports pain is sharp, she is having nausea but no vomiting.  Patient is 1 month post partum.

## 2017-05-15 NOTE — ED Notes (Signed)
Pt aware of need for urine specimen. 

## 2017-05-15 NOTE — H&P (Signed)
[facility] [place] [facility]   PATIENT NAME: Elizabeth PyleKimberly Vaness    MR#:  [id number]  DATE OF BIRTH:  09/20/1995  DATE OF ADMISSION:  05/15/2017  PRIMARY CARE PHYSICIAN: Patient, No Pcp Per   REQUESTING/REFERRING PHYSICIAN: Dr. Mayford KnifeWilliams  CHIEF COMPLAINT:   Chief Complaint  Patient presents with  . Abdominal Pain    HISTORY OF PRESENT ILLNESS:  Elizabeth Rush  is a 22 y.o. female with a known history of recent pregnancy and delivery 1 month back presents to the emergency room complaining of 2 days of right upper quadrant pain.  Here patient's liver function tests were found to be significantly elevated with ultrasound raising concern for CBD stone.  CBD 7 mm. Afebrile.  Normal WBC. Case was discussed with GI and patient is being admitted for MRCP/ERCP.  PAST MEDICAL HISTORY:   Past Medical History:  Diagnosis Date  . Medical history non-contributory   . Seasonal affective disorder (HCC)     PAST SURGICAL HISTORY:   Past Surgical History:  Procedure Laterality Date  . NO PAST SURGERIES      SOCIAL HISTORY:   Social History   Tobacco Use  . Smoking status: Never Smoker  . Smokeless tobacco: Never Used  Substance Use Topics  . Alcohol use: No    Comment: occas    FAMILY HISTORY:   Family History  Problem Relation Age of Onset  . Thyroid disease Mother   . Cancer Maternal Grandfather     DRUG ALLERGIES:   Allergies  Allergen Reactions  . Corn Syrup [Glucose] Hives, Diarrhea and Nausea And Vomiting    REVIEW OF SYSTEMS:   Review of Systems  Constitutional: Positive for malaise/fatigue. Negative for chills and fever.  HENT: Negative for sore throat.   Eyes: Negative for blurred vision, double vision and pain.  Respiratory: Negative for cough, hemoptysis, shortness of breath and wheezing.   Cardiovascular: Negative for chest pain, palpitations, orthopnea and leg swelling.  Gastrointestinal: Positive for abdominal pain and nausea.  Negative for constipation, diarrhea, heartburn and vomiting.  Genitourinary: Negative for dysuria and hematuria.  Musculoskeletal: Negative for back pain and joint pain.  Skin: Negative for rash.  Neurological: Negative for sensory change, speech change, focal weakness and headaches.  Endo/Heme/Allergies: Does not bruise/bleed easily.  Psychiatric/Behavioral: Negative for depression. The patient is not nervous/anxious.     MEDICATIONS AT HOME:   Prior to Admission medications   Medication Sig Start Date End Date Taking? Authorizing Provider  b complex vitamins tablet Take 1 tablet by mouth daily.   Yes [provider]     VITAL SIGNS:  Blood pressure 137/86, pulse 77, temperature 98.4 F (36.9 C), temperature source Oral, resp. rate 18, SpO2 98 %, not currently breastfeeding.  PHYSICAL EXAMINATION:  Physical Exam  GENERAL:  22 y.o.-year-old patient lying in the bed with no acute distress.  EYES: Pupils equal, round, reactive to light and accommodation. No scleral icterus. Extraocular muscles intact.  HEENT: Head atraumatic, normocephalic. Oropharynx and nasopharynx clear. No oropharyngeal erythema, moist oral mucosa  NECK:  Supple, no jugular venous distention. No thyroid enlargement, no tenderness.  LUNGS: Normal breath sounds bilaterally, no wheezing, rales, rhonchi. No use of accessory muscles of respiration.  CARDIOVASCULAR: S1, S2 normal. No murmurs, rubs, or gallops.  ABDOMEN: Soft, right upper quadrant tenderness, nondistended. Bowel sounds present. No organomegaly or mass.  EXTREMITIES: No pedal edema, cyanosis, or clubbing. + 2 pedal & radial pulses b/l.   NEUROLOGIC: Cranial nerves II  through XII are intact. No focal Motor or sensory deficits appreciated b/l PSYCHIATRIC: The patient is alert and oriented x 3. Good affect.  SKIN: No obvious rash, lesion, or ulcer.   LABORATORY PANEL:   CBC Recent Labs  Lab 05/15/17 1741  WBC 6.3  HGB 14.9  HCT 43.6  PLT  255   ------------------------------------------------------------------------------------------------------------------  Chemistries  Recent Labs  Lab 05/15/17 1741  NA 137  K 3.7  CL 101  CO2 29  GLUCOSE 114*  BUN 11  CREATININE 0.58  CALCIUM 9.2  AST 412*  ALT 574*  ALKPHOS 250*  BILITOT 4.1*   ------------------------------------------------------------------------------------------------------------------  Cardiac Enzymes No results for input(s): TROPONINI in the last 168 hours. ------------------------------------------------------------------------------------------------------------------  RADIOLOGY:  US Abdomen Limited Ruq  Result Date: 05/15/2017 CLINICAL DATA:  Right upper quadrant pain EXAM: ULTRASOUND ABDOMEN LIMITED RIGHT UPPER QUADRANT COMPARISON:  None. FINDINGS: Gallbladder: Within the gallbladder, there is a 1.4 cm echogenic focus which moves in shadows consistent with cholelithiasis. No gallbladder wall thickening or pericholecystic fluid. No sonographic Murphy sign noted by sonographer. Common bile duct: Diameter: 7 mm, upper normal. No biliary duct mass or calculus evident. Liver: No focal lesion identified. Within normal limits in parenchymal echogenicity. Portal vein is patent on color Doppler imaging with normal direction of blood flow towards the liver. IMPRESSION: Cholelithiasis. Upper normal common bile duct without biliary duct mass or calculus evident by ultrasound. No liver lesions evident. Electronically Signed   By: Bretta Bang III M.D.   On: 05/15/2017 18:10     IMPRESSION AND PLAN:   *Choledocholithiasis. No stone clearly seen on ultrasound in the CBD but patient has elevated LFTs and bilirubin.  MRCP ordered.  Case was discussed with GI.  Depending on liver numbers and MRCP findings may need ERCP and later cholecystectomy.  Afebrile.  Normal WBC. Added pain medications as needed.  Clear liquids.  N.p.o. after midnight.  DVT prophylaxis  with Lovenox  All the records are reviewed and case discussed with ED provider. Management plans discussed with the patient, family and they are in agreement.  CODE STATUS: FULL CODE  TOTAL TIME TAKING CARE OF THIS PATIENT: 40 minutes.   Orie Fisherman M.D on 05/15/2017 at 7:44 PM  Between 7am to 6pm - Pager - [phone]  After 6pm go to [email or web address] - password [email or web address]  [facility]  Office  [phone]  CC: Primary care physician; Patient, No Pcp Per  Note: This dictation was prepared with Dragon dictation along with smaller phrase technology. Any transcriptional errors that result from this process are unintentional.

## 2017-05-15 NOTE — ED Triage Notes (Addendum)
[facility] RUQ abdominal pain that started 2 days ago. Pt reports nausea associated with pain. Pt describes the pain as "really sore".   Pt had baby 1 month via vaginal delivery.  Pt still has gallbladder and appendix.

## 2017-05-15 NOTE — ED Provider Notes (Signed)
[facility] Emergency Department Provider Note       Time seen: ----------------------------------------- 5:33 PM on 05/15/2017 -----------------------------------------   I have reviewed the triage vital signs and the nursing notes.  HISTORY   Chief Complaint Abdominal Pain    HPI Elizabeth Rush is a 22 y.o. female with a history of seasonal affective disorder who presents to the ED for right upper quadrant abdominal pain that started 2 days ago.  Patient reports nausea associated with the pain, describes the pain as a soreness.  She had a baby about 1 month ago via vaginal delivery, she still has her gallbladder and her appendix.  Past Medical History:  Diagnosis Date  . Medical history non-contributory   . Seasonal affective disorder [facility](HCC)     Patient Active Problem List   Diagnosis Date Noted  . Labor and delivery, indication for care 04/08/2017  . Indication for care in labor or delivery 04/03/2017    Past Surgical History:  Procedure Laterality Date  . NO PAST SURGERIES      Allergies Corn syrup [glucose]  Social History Social History   Tobacco Use  . Smoking status: Never Smoker  . Smokeless tobacco: Never Used  Substance Use Topics  . Alcohol use: No    Comment: occas  . Drug use: No    Review of Systems Constitutional: Negative for fever. Cardiovascular: Negative for chest pain. Respiratory: Negative for shortness of breath. Gastrointestinal: Positive for abdominal pain, nausea Musculoskeletal: Negative for back pain. Skin: Negative for rash. Neurological: Negative for headaches, focal weakness or numbness.  All systems negative/normal/unremarkable except as stated in the HPI  ____________________________________________   PHYSICAL EXAM:  VITAL SIGNS: ED Triage Vitals  Enc Vitals Group     BP 05/15/17 1727 137/86     Pulse Rate 05/15/17 1727 77     Resp 05/15/17 1727 18     Temp 05/15/17 1727 98.4 F (36.9  C)     Temp Source 05/15/17 1727 Oral     SpO2 05/15/17 1727 98 %     Weight --      Height --      Head Circumference --      Peak Flow --      Pain Score 05/15/17 1726 6     Pain Loc --      Pain Edu? --      Excl. in GC? --    Constitutional: Alert and oriented. Well appearing and in no distress. Eyes: Conjunctivae are normal. Normal extraocular movements. ENT   Head: Normocephalic and atraumatic.   Nose: No congestion/rhinnorhea.   Mouth/Throat: Mucous membranes are moist.   Neck: No stridor. Cardiovascular: Normal rate, regular rhythm. No murmurs, rubs, or gallops. Respiratory: Normal respiratory effort without tachypnea nor retractions. Breath sounds are clear and equal bilaterally. No wheezes/rales/rhonchi. Gastrointestinal: Right upper quadrant and epigastric pain, no rebound or guarding.  Normal bowel sounds. Musculoskeletal: Nontender with normal range of motion in extremities. No lower extremity tenderness nor edema. Neurologic:  Normal speech and language. No gross focal neurologic deficits are appreciated.  Skin:  Skin is warm, dry and intact. No rash noted. ____________________________________________  ED COURSE:  As part of my medical decision making, I reviewed the following data within the electronic MEDICAL RECORD NUMBER History obtained from family if available, nursing notes, old chart and ekg, as well as notes from prior ED visits. Patient presented for abdominal pain, we will assess with labs and imaging as indicated at this time.  Clinical Course as of May 15 1909  Sun May 15, 2017  1852 AST(!): 412 [JW]  1852 ALT(!): 574 [JW]  1852 Alkaline Phosphatase(!): 250 [JW]  1852 Total Bilirubin(!): 4.1 [JW]    Clinical Course User Index [JW] Emily Filbert, MD   Procedures ____________________________________________   LABS (pertinent positives/negatives)  Labs Reviewed  COMPREHENSIVE METABOLIC PANEL - Abnormal; Notable for the following  components:      Result Value   Glucose, Bld 114 (*)    AST 412 (*)    ALT 574 (*)    Alkaline Phosphatase 250 (*)    Total Bilirubin 4.1 (*)    All other components within normal limits  URINALYSIS, COMPLETE (UACMP) WITH MICROSCOPIC - Abnormal; Notable for the following components:   Color, Urine AMBER (*)    APPearance CLEAR (*)    Specific Gravity, Urine 1.004 (*)    Leukocytes, UA TRACE (*)    Bacteria, UA RARE (*)    Squamous Epithelial / LPF 0-5 (*)    All other components within normal limits  LIPASE, BLOOD  CBC  POC URINE PREG, ED  POCT PREGNANCY, URINE    RADIOLOGY Images were viewed by me  Right upper quadrant ultrasound IMPRESSION: Cholelithiasis. Upper normal common bile duct without biliary duct mass or calculus evident by ultrasound. No liver lesions evident.  ____________________________________________  DIFFERENTIAL DIAGNOSIS   GERD, peptic ulcer disease, cholelithiasis, cholecystitis  FINAL ASSESSMENT AND PLAN  Choledocholithiasis   Plan: The patient had presented for abdominal pain. Patient's labs were concerning for choledocholithiasis, total bilirubin is elevated as well as her LFTs. Patient's imaging did reveal an upper limit size for her common bile duct.  I discussed with gastroenterology on-call who recommended MRCP and likely ERCP tomorrow.  Patient is agreeable to this plan, I will discussed with the hospitalist.   Ulice Dash, MD   Note: This note was generated in part or whole with voice recognition software. Voice recognition is usually quite accurate but there are transcription errors that can and very often do occur. I apologize for any typographical errors that were not detected and corrected.     Emily Filbert, MD 05/15/17 [phone]

## 2017-05-15 NOTE — ED Notes (Signed)
Report called to tamara rn floor nurse

## 2017-05-16 ENCOUNTER — Encounter: Payer: Self-pay | Admitting: *Deleted

## 2017-05-16 ENCOUNTER — Inpatient Hospital Stay: Payer: BLUE CROSS/BLUE SHIELD

## 2017-05-16 DIAGNOSIS — R1011 Right upper quadrant pain: Secondary | ICD-10-CM

## 2017-05-16 DIAGNOSIS — K8071 Calculus of gallbladder and bile duct without cholecystitis with obstruction: Secondary | ICD-10-CM

## 2017-05-16 LAB — COMPREHENSIVE METABOLIC PANEL
ALBUMIN: 3.7 g/dL (ref 3.5–5.0)
ALT: 435 U/L — AB (ref 14–54)
AST: 225 U/L — AB (ref 15–41)
Alkaline Phosphatase: 212 U/L — ABNORMAL HIGH (ref 38–126)
Anion gap: 5 (ref 5–15)
BUN: 9 mg/dL (ref 6–20)
CHLORIDE: 108 mmol/L (ref 101–111)
CO2: 25 mmol/L (ref 22–32)
CREATININE: 0.68 mg/dL (ref 0.44–1.00)
Calcium: 8.3 mg/dL — ABNORMAL LOW (ref 8.9–10.3)
GFR calc Af Amer: >60 mL/min (ref >60)
GFR calc non Af Amer: >60 mL/min (ref >60)
Glucose, Bld: 95 mg/dL (ref 65–99)
Potassium: 3.5 mmol/L (ref 3.5–5.1)
Sodium: 138 mmol/L (ref 135–145)
Total Bilirubin: 1.3 mg/dL — ABNORMAL HIGH (ref 0.3–1.2)
Total Protein: 6.4 g/dL — ABNORMAL LOW (ref 6.5–8.1)

## 2017-05-16 MED ORDER — GADOBENATE DIMEGLUMINE 529 MG/ML IV SOLN
15.0000 mL | Freq: Once | INTRAVENOUS | Status: AC | PRN
  Administered 2017-05-16: 15 mL via INTRAVENOUS

## 2017-05-16 NOTE — Progress Notes (Signed)
Patient ID: Elizabeth Rush, female   DOB: 09/18/1995, 22 y.o.   MRN: [id number]  MRCP negative. D/w dr Earlene Platerdavis to see pt Pt updated with results GI DR Maximino Greenlandahiliani aware

## 2017-05-16 NOTE — Consult Note (Signed)
SURGICAL CONSULTATION NOTE (initial) - cpt: 16109  HISTORY OF PRESENT ILLNESS (HPI):  22 y.o. female presented to [facility] ED 2 days ago for evaluation of abdominal pain. Patient reports since delivering her second child 1 month ago, she 1 week ago developed severe RUQ abdominal pain, worse after eating, for which she presented to [facility] ED and was found to have hyperbilirubinemia associated with cholelithiasis and dilated CBD to 7 mm. Patient was admitted to hospitalist service with GI consultation and MRCP. Today, patient reports her pain has completely resolved, and she denies N/V, fever/chills, CP, or SOB.  Surgery is consulted by medical physician Dr. Allena Katz in this context for evaluation and management of choledocholithiasis.  PAST MEDICAL HISTORY (PMH):  Past Medical History:  Diagnosis Date  . Medical history non-contributory   . Seasonal affective disorder (HCC)      PAST SURGICAL HISTORY (PSH):  Past Surgical History:  Procedure Laterality Date  . NO PAST SURGERIES       MEDICATIONS:  Prior to Admission medications   Medication Sig Start Date End Date Taking? Authorizing Provider  b complex vitamins tablet Take 1 tablet by mouth daily.   Yes [provider]     ALLERGIES:  Allergies  Allergen Reactions  . Corn Syrup [Glucose] Hives, Diarrhea and Nausea And Vomiting     SOCIAL HISTORY:  Social History   Socioeconomic History  . Marital status: Married    Spouse name: Not on file  . Number of children: Not on file  . Years of education: Not on file  . Highest education level: Not on file  Occupational History  . Not on file  Social Needs  . Financial resource strain: Not on file  . Food insecurity:    Worry: Not on file    Inability: Not on file  . Transportation needs:    Medical: Not on file    Non-medical: Not on file  Tobacco Use  . Smoking status: Never Smoker  . Smokeless tobacco: Never Used  Substance and Sexual Activity  . Alcohol use: No     Comment: occas  . Drug use: No  . Sexual activity: Not Currently    Birth control/protection: None    Comment: still deciding   Lifestyle  . Physical activity:    Days per week: Not on file    Minutes per session: Not on file  . Stress: Not on file  Relationships  . Social connections:    Talks on phone: Not on file    Gets together: Not on file    Attends religious service: Not on file    Active member of club or organization: Not on file    Attends meetings of clubs or organizations: Not on file    Relationship status: Not on file  . Intimate partner violence:    Fear of current or ex partner: Not on file    Emotionally abused: Not on file    Physically abused: Not on file    Forced sexual activity: Not on file  Other Topics Concern  . Not on file  Social History Narrative  . Not on file    The patient currently resides (home / rehab facility / nursing home): Home The patient normally is (ambulatory / bedbound): Ambulatory   FAMILY HISTORY:  Family History  Problem Relation Age of Onset  . Thyroid disease Mother   . Cancer Maternal Grandfather      REVIEW OF SYSTEMS:  Constitutional: denies weight loss, fever,  chills, or sweats  Eyes: denies any other vision changes, history of eye injury  ENT: denies sore throat, hearing problems  Respiratory: denies shortness of breath, wheezing  Cardiovascular: denies chest pain, palpitations  Gastrointestinal: abdominal pain, N/V, and bowel function as per HPI Genitourinary: denies burning with urination or urinary frequency Musculoskeletal: denies any other joint pains or cramps  Skin: denies any other rashes or skin discolorations  Neurological: denies any other headache, dizziness, weakness  Psychiatric: denies any other depression, anxiety   All other review of systems were negative   VITAL SIGNS:  Temp:  [97.6 F (36.4 C)-98.4 F (36.9 C)] 98 F (36.7 C) (04/15 0430) Pulse Rate:  [77-95] 80 (04/15 0430) Resp:   [18-20] 18 (04/15 0430) BP: (111-137)/(65-86) 111/65 (04/15 0430) SpO2:  [98 %-100 %] 99 % (04/15 0430) Weight:  [164 lb (74.4 kg)-171 lb (77.6 kg)] 171 lb (77.6 kg) (04/15 0500)     Height: 5\' 6"  (167.6 cm) Weight: 171 lb (77.6 kg) BMI (Calculated): 27.61   INTAKE/OUTPUT:  This shift: No intake/output data recorded.  Last 2 shifts: @IOLAST2SHIFTS @   PHYSICAL EXAM:  Constitutional:  -- Normal body habitus  -- Awake, alert, and oriented x3, no apparent distress Eyes:  -- Pupils equally round and reactive to light  -- No scleral icterus, B/L no occular discharge Ear, nose, throat: -- Neck is FROM WNL -- No jugular venous distension  Pulmonary:  -- No wheezes or rhales -- Equal breath sounds bilaterally -- Breathing non-labored at rest Cardiovascular:  -- S1, S2 present  -- No pericardial rubs  Gastrointestinal:  -- Abdomen now soft, completely non-tender, and non-distended with no guarding or rebound tenderness -- No abdominal masses appreciated, pulsatile or otherwise  Musculoskeletal and Integumentary:  -- Wounds or skin discoloration: None appreciated -- Extremities: B/L UE and LE FROM, hands and feet warm, no edema  Neurologic:  -- Motor function: Intact and symmetric -- Sensation: Intact and symmetric Psychiatric:  -- Mood and affect WNL  Labs:  CBC Latest Ref Rng & Units 05/15/2017 04/09/2017 04/08/2017  WBC 3.6 - 11.0 K/uL 6.3 12.0(H) 16.1(H)  Hemoglobin 12.0 - 16.0 g/dL 16.1 09.6 04.5  Hematocrit 35.0 - 47.0 % 43.6 36.9 39.1  Platelets 150 - 440 K/uL 255 197 250   CMP Latest Ref Rng & Units 05/16/2017 05/15/2017  Glucose 65 - 99 mg/dL 95 409(W)  BUN 6 - 20 mg/dL 9 11  Creatinine 1.19 - 1.00 mg/dL 1.47 8.29  Sodium 562 - 145 mmol/L 138 137  Potassium 3.5 - 5.1 mmol/L 3.5 3.7  Chloride 101 - 111 mmol/L 108 101  CO2 22 - 32 mmol/L 25 29  Calcium 8.9 - 10.3 mg/dL 8.3(L) 9.2  Total Protein 6.5 - 8.1 g/dL 6.4(L) 7.6  Total Bilirubin 0.3 - 1.2 mg/dL 1.3(Y) 4.1(H)   Alkaline Phos 38 - 126 U/L 212(H) 250(H)  AST 15 - 41 U/L 225(H) 412(H)  ALT 14 - 54 U/L 435(H) 574(H)   Imaging studies:  MRCP (05/16/2017) Tiny gallstones without substantial biliary dilatation. No MRCP evidence for choledocholithiasis.  Assessment/Plan: (ICD-10's: K80.71) 22 y.o. female with resolving RUQ abdominal pain and hyperbilirubinemia attributed to choledocholithiasis, complicated by pertinent comorbidities including 1 month postpartum.   - clear liquids diet okay for now, NPO after midnight  - will check/trend repeat LFT's for tomorrow morning  - all risks, benefits, and alternatives to cholecystectomy with intraoperative cholangiogram were discussed with the patient and her husband, all of their questions were answered to their  expressed satisfaction, patient expresses she wishes to proceed, and informed consent was obtained.  - will plan for laparoscopic cholecystectomy pending morning LFT's and OR availability  - okay to transfer patient to surgical service, discussed with Dr. Allena KatzPatel  - GI consultation and recommendations appreciated  - DVT prophylaxis, ambulation encouraged  All of the above findings and recommendations were discussed with the patient and her husband, and all of patient's and her husband's questions were answered to their expressed satisfaction.  Thank you for the opportunity to participate in this patient's care.   -- Scherrie GerlachJason E. Earlene Plateravis, MD, RPVI [place]: [facility] General [facility] - Partnering for exceptional care. Office: [phone]

## 2017-05-16 NOTE — Progress Notes (Signed)
[facility] [place] [facility]   PATIENT NAME: Elizabeth PyleKimberly Rush    MR#:  [id number]  DATE OF BIRTH:  03/11/1995  SUBJECTIVE:  patient came in with increasing the epigastric/right upper quadrant abdominal pain. First episode started last Tuesday resolved and recurred again on Saturday evening. She was found to have gallstones. Does not have pain at present. She is NPO.  REVIEW OF SYSTEMS:   Review of Systems  Constitutional: Negative for chills, fever and weight loss.  HENT: Negative for ear discharge, ear pain and nosebleeds.   Eyes: Negative for blurred vision, pain and discharge.  Respiratory: Negative for sputum production, shortness of breath, wheezing and stridor.   Cardiovascular: Negative for chest pain, palpitations, orthopnea and PND.  Gastrointestinal: Positive for abdominal pain. Negative for diarrhea, nausea and vomiting.  Genitourinary: Negative for frequency and urgency.  Musculoskeletal: Negative for back pain and joint pain.  Neurological: Negative for sensory change, speech change, focal weakness and weakness.  Psychiatric/Behavioral: Negative for depression and hallucinations. The patient is not nervous/anxious.    Tolerating Diet:npo Tolerating PT: not needed  DRUG ALLERGIES:   Allergies  Allergen Reactions  . Corn Syrup [Glucose] Hives, Diarrhea and Nausea And Vomiting    VITALS:  Blood pressure 111/65, pulse 80, temperature 98 F (36.7 C), temperature source Oral, resp. rate 18, height 5\' 6"  (1.676 m), weight 77.6 kg (171 lb), SpO2 99 %, not currently breastfeeding.  PHYSICAL EXAMINATION:   Physical Exam  GENERAL:  22 y.o.-year-old patient lying in the bed with no acute distress.  EYES: Pupils equal, round, reactive to light and accommodation. No scleral icterus. Extraocular muscles intact.  HEENT: Head atraumatic, normocephalic. Oropharynx and nasopharynx clear.  NECK:  Supple, no jugular venous distention. No thyroid  enlargement, no tenderness.  LUNGS: Normal breath sounds bilaterally, no wheezing, rales, rhonchi. No use of accessory muscles of respiration.  CARDIOVASCULAR: S1, S2 normal. No murmurs, rubs, or gallops.  ABDOMEN: Soft, nontender, nondistended. Bowel sounds present. No organomegaly or mass.  EXTREMITIES: No cyanosis, clubbing or edema b/l.    NEUROLOGIC: Cranial nerves II through XII are intact. No focal Motor or sensory deficits b/l.   PSYCHIATRIC:  patient is alert and oriented x 3.  SKIN: No obvious rash, lesion, or ulcer.   LABORATORY PANEL:  CBC Recent Labs  Lab 05/15/17 1741  WBC 6.3  HGB 14.9  HCT 43.6  PLT 255    Chemistries  Recent Labs  Lab 05/16/17 0511  NA 138  K 3.5  CL 108  CO2 25  GLUCOSE 95  BUN 9  CREATININE 0.68  CALCIUM 8.3*  AST 225*  ALT 435*  ALKPHOS 212*  BILITOT 1.3*   Cardiac Enzymes No results for input(s): TROPONINI in the last 168 hours. RADIOLOGY:  [place] Abdomen Limited Ruq  Result Date: 05/15/2017 CLINICAL DATA:  Right upper quadrant pain EXAM: ULTRASOUND ABDOMEN LIMITED RIGHT UPPER QUADRANT COMPARISON:  None. FINDINGS: Gallbladder: Within the gallbladder, there is a 1.4 cm echogenic focus which moves in shadows consistent with cholelithiasis. No gallbladder wall thickening or pericholecystic fluid. No sonographic Murphy sign noted by sonographer. Common bile duct: Diameter: 7 mm, upper normal. No biliary duct mass or calculus evident. Liver: No focal lesion identified. Within normal limits in parenchymal echogenicity. Portal vein is patent on color Doppler imaging with normal direction of blood flow towards the liver. IMPRESSION: Cholelithiasis. Upper normal common bile duct without biliary duct mass or calculus evident by ultrasound. No liver lesions evident. Electronically  Signed   By: Bretta Bang III M.D.   On: 05/15/2017 18:10   ASSESSMENT AND PLAN:   Elizabeth Rush  is a 22 y.o. female with a known history of recent pregnancy and  delivery 1 month back presents to the emergency room complaining of 2 days of right upper quadrant pain.  Here patient's liver function tests were found to be significantly elevated with ultrasound raising concern for CBD stone.  CBD 7 mm.  *Choledocholithiasis/acute cholelithiasis No stone clearly seen on ultrasound in the CBD but patient has elevated LFTs and bilirubin. -  MRCP ordered.   -Case was discussed with GI.  - Depending on liver numbers and MRCP findings may need ERCP and later cholecystectomy.  -Afebrile.  Normal WBC. -Added pain medications as needed.  Clear liquids.  N.p.o. after midnight.  *DVT prophylaxis patient is ambulatory. Will avoid antiplatelet agent in the setting of possibility for ERCP and gallbladder surgery - pt is active    Case discussed with Care Management/Social Worker. Management plans discussed with the patient, family and they are in agreement.  CODE STATUS: full  DVT Prophylaxis: ambulatory  TOTAL TIME TAKING CARE OF THIS PATIENT: *40* minutes.  >50% time spent on counselling and coordination of care  POSSIBLE D/C IN 1-2 DAYS, DEPENDING ON CLINICAL CONDITION.  Note: This dictation was prepared with Dragon dictation along with smaller phrase technology. Any transcriptional errors that result from this process are unintentional.  Enedina Finner M.D on 05/16/2017 at 11:46 AM  Between 7am to 6pm - Pager - [phone]  After 6pm go to [email or web address] - password [email or web address]  [facility]  Office  [phone]  CC: Primary care physician; Patient, No Pcp PerPatient ID: Elizabeth Rush, female   DOB: [birth year], 22 y.o.   MRN: [id number]

## 2017-05-16 NOTE — Progress Notes (Signed)
Patient ID: Elizabeth PyleKimberly Rush, female   DOB: 08/21/1995, 22 y.o.   MRN: [id number]  Pt will be transferred under Dr Jinny Sandersavis's service.  Medicine will sign off. Thanks and call if needed.

## 2017-05-16 NOTE — Consult Note (Signed)
Melodie BouillonVarnita Mearl Olver, MD [address], [address], [place], [place], [address] [address], [address], [place], [place], [address] Phone: [phone]  Fax: [phone]  Consultation  Referring Provider:     Dr. Allena KatzPatel Primary Care Physician:  Patient, No Pcp Per Reason for Consultation:     Elevated liver enzymes  Date of Admission:  05/15/2017 Date of Consultation:  05/16/2017         HPI:   Nils PyleKimberly Kiene is a 22 y.o. female presents with right upper quadrant abdominal pain, that started 1 week ago, intermittent, worsening with meals, with no radiation, sharp, 8/10, lasting about 30 minutes.  The pain has completely resolved today.  She denies any nausea or vomiting.  Is hungry and would like to eat.  No fever or chills.  No previous history of similar symptoms.  Reports taking Tylenol every 6 hours, 500 mg Tylenol tablets, when the pain started.  No NSAID use.  No herbal supplements, over-the-counter supplements, green tea or other product to use.  Denies any recent alcohol use.  Past Medical History:  Diagnosis Date  . Medical history non-contributory   . Seasonal affective disorder [facility](HCC)     Past Surgical History:  Procedure Laterality Date  . NO PAST SURGERIES      Prior to Admission medications   Medication Sig Start Date End Date Taking? Authorizing Provider  b complex vitamins tablet Take 1 tablet by mouth daily.   Yes [provider]    Family History  Problem Relation Age of Onset  . Thyroid disease Mother   . Cancer Maternal Grandfather      Social History   Tobacco Use  . Smoking status: Never Smoker  . Smokeless tobacco: Never Used  Substance Use Topics  . Alcohol use: No    Comment: occas  . Drug use: No    Allergies as of 05/15/2017 - Review Complete 05/15/2017  Allergen Reaction Noted  . Corn syrup [glucose] Hives, Diarrhea, and Nausea And Vomiting 04/03/2017    Review of Systems:    All systems reviewed and negative except where noted  in HPI.   Physical Exam:  Vital signs in last 24 hours: Vitals:   05/15/17 2046 05/15/17 2105 05/16/17 0430 05/16/17 0500  BP: 122/72  111/65   Pulse: 95  80   Resp: 20  18   Temp: 97.6 F (36.4 C)  98 F (36.7 C)   TempSrc: Oral  Oral   SpO2: 100%  99%   Weight:  164 lb (74.4 kg)  171 lb (77.6 kg)  Height:  5\' 6"  (1.676 m)     Last BM Date: 05/16/17 General:   Pleasant, cooperative in NAD Head:  Normocephalic and atraumatic. Eyes:   No icterus.   Conjunctiva pink. PERRLA. Ears:  Normal auditory acuity. Neck:  Supple; no masses or thyroidomegaly Lungs: Respirations even and unlabored. Lungs clear to auscultation bilaterally.   No wheezes, crackles, or rhonchi.  Abdomen:  Soft, nondistended, nontender. Normal bowel sounds. No appreciable masses or hepatomegaly.  No rebound or guarding.  Neurologic:  Alert and oriented x3;  grossly normal neurologically. Skin:  Intact without significant lesions or rashes. Cervical Nodes:  No significant cervical adenopathy. Psych:  Alert and cooperative. Normal affect.  LAB RESULTS: Recent Labs    05/15/17 1741  WBC 6.3  HGB 14.9  HCT 43.6  PLT 255   BMET Recent Labs    05/15/17 1741 05/16/17 0511  NA 137 138  K 3.7  3.5  CL 101 108  CO2 29 25  GLUCOSE 114* 95  BUN 11 9  CREATININE 0.58 0.68  CALCIUM 9.2 8.3*   LFT Recent Labs    05/16/17 0511  PROT 6.4*  ALBUMIN 3.7  AST 225*  ALT 435*  ALKPHOS 212*  BILITOT 1.3*   PT/INR No results for input(s): LABPROT, INR in the last 72 hours.  STUDIES: Mr 3d Recon At Scanner  Result Date: 05/16/2017 CLINICAL DATA:  Two-day history of right upper quadrant pain. Abnormal liver function tests. EXAM: MRI ABDOMEN WITHOUT AND WITH CONTRAST (INCLUDING MRCP) TECHNIQUE: Multiplanar multisequence MR imaging of the abdomen was performed both before and after the administration of intravenous contrast. Heavily T2-weighted images of the biliary and pancreatic ducts were obtained, and  three-dimensional MRCP images were rendered by post processing. CONTRAST:  15mL MULTIHANCE GADOBENATE DIMEGLUMINE 529 MG/ML IV SOLN COMPARISON:  Ultrasound exam from 05/15/2017. FINDINGS: Lower chest: Unremarkable. Hepatobiliary: Liver measures 16.2 cm craniocaudal length. Signal intensity of the hepatic parenchyma is within normal limits. Several very tiny gallstones are identified. Common duct measures 7 mm diameter in the hepato duodenal ligament. The common bile duct in the head of the pancreas measures 5 mm diameter. No MRCP evidence for choledocholithiasis. Pancreas: No focal mass lesion. No dilatation of the main duct. No intraparenchymal cyst. No peripancreatic edema. Spleen:  No splenomegaly. No focal mass lesion. Adrenals/Urinary Tract: No adrenal nodule or mass. Kidneys unremarkable. Stomach/Bowel: Stomach is nondistended. No gastric wall thickening. No evidence of outlet obstruction. Duodenum is normally positioned as is the ligament of Treitz. No small bowel or colonic dilatation. Vascular/Lymphatic: No abdominal aortic aneurysm. There is no gastrohepatic or hepatoduodenal ligament lymphadenopathy. No intraperitoneal or retroperitoneal lymphadenopathy. Other:  No intraperitoneal free fluid. Musculoskeletal: No abnormal marrow enhancement within the visualized bony anatomy. IMPRESSION: 1. Tiny gallstones without substantial biliary dilatation. No MRCP evidence for choledocholithiasis. 2. Otherwise unremarkable exam. Electronically Signed   By: Kennith Center M.D.   On: 05/16/2017 12:53   Mr Abdomen Mrcp Vivien Rossetti Contast  Result Date: 05/16/2017 CLINICAL DATA:  Two-day history of right upper quadrant pain. Abnormal liver function tests. EXAM: MRI ABDOMEN WITHOUT AND WITH CONTRAST (INCLUDING MRCP) TECHNIQUE: Multiplanar multisequence MR imaging of the abdomen was performed both before and after the administration of intravenous contrast. Heavily T2-weighted images of the biliary and pancreatic ducts were  obtained, and three-dimensional MRCP images were rendered by post processing. CONTRAST:  15mL MULTIHANCE GADOBENATE DIMEGLUMINE 529 MG/ML IV SOLN COMPARISON:  Ultrasound exam from 05/15/2017. FINDINGS: Lower chest: Unremarkable. Hepatobiliary: Liver measures 16.2 cm craniocaudal length. Signal intensity of the hepatic parenchyma is within normal limits. Several very tiny gallstones are identified. Common duct measures 7 mm diameter in the hepato duodenal ligament. The common bile duct in the head of the pancreas measures 5 mm diameter. No MRCP evidence for choledocholithiasis. Pancreas: No focal mass lesion. No dilatation of the main duct. No intraparenchymal cyst. No peripancreatic edema. Spleen:  No splenomegaly. No focal mass lesion. Adrenals/Urinary Tract: No adrenal nodule or mass. Kidneys unremarkable. Stomach/Bowel: Stomach is nondistended. No gastric wall thickening. No evidence of outlet obstruction. Duodenum is normally positioned as is the ligament of Treitz. No small bowel or colonic dilatation. Vascular/Lymphatic: No abdominal aortic aneurysm. There is no gastrohepatic or hepatoduodenal ligament lymphadenopathy. No intraperitoneal or retroperitoneal lymphadenopathy. Other:  No intraperitoneal free fluid. Musculoskeletal: No abnormal marrow enhancement within the visualized bony anatomy. IMPRESSION: 1. Tiny gallstones without substantial biliary dilatation. No MRCP evidence for choledocholithiasis.  2. Otherwise unremarkable exam. Electronically Signed   By: Kennith Center M.D.   On: 05/16/2017 12:53   US Abdomen Limited Ruq  Result Date: 05/15/2017 CLINICAL DATA:  Right upper quadrant pain EXAM: ULTRASOUND ABDOMEN LIMITED RIGHT UPPER QUADRANT COMPARISON:  None. FINDINGS: Gallbladder: Within the gallbladder, there is a 1.4 cm echogenic focus which moves in shadows consistent with cholelithiasis. No gallbladder wall thickening or pericholecystic fluid. No sonographic Murphy sign noted by sonographer.  Common bile duct: Diameter: 7 mm, upper normal. No biliary duct mass or calculus evident. Liver: No focal lesion identified. Within normal limits in parenchymal echogenicity. Portal vein is patent on color Doppler imaging with normal direction of blood flow towards the liver. IMPRESSION: Cholelithiasis. Upper normal common bile duct without biliary duct mass or calculus evident by ultrasound. No liver lesions evident. Electronically Signed   By: Bretta Bang III M.D.   On: 05/15/2017 18:10      Impression / Plan:   Janey Petron is a 22 y.o. y/o female with right upper quadrant abdominal pain, and elevated liver enzymes   Initial imaging, ultrasound showed cholelithiasis and common bile duct upper limit of normal. Follow-up MRI today shows no choledocholithiasis, but small stones in the gallbladder seen High resolution of pain spontaneously today, and decreasing liver enzymes, and MRI are all consistent with  passed stones She will need a surgery consult to evaluate for cholecystectomy at this time  Primary team can restart diet, pending surgical evaluation  Continue daily CMP elevated  If transaminases improve, no further workup needed If liver enzymes remain elevated, can also obtain hepatitis panel to rule out acute hepatitis However, elevated transaminases are likely due to her choledocholithiasis, that has been ongoing for 1 week Now with resolution with IV fluids and Conservative management, transaminases are improving as well.    Avoid hepatotoxic drugs  Thank you for involving me in the care of this patient.      LOS: 1 day   Pasty Spillers, MD  05/16/2017, 2:47 PM

## 2017-05-17 ENCOUNTER — Inpatient Hospital Stay: Payer: BLUE CROSS/BLUE SHIELD | Admitting: Anesthesiology

## 2017-05-17 ENCOUNTER — Encounter
Admission: EM | Disposition: A | Discharge status: Home / Self Care | Payer: Self-pay | Source: Home / Self Care | Attending: Surgery

## 2017-05-17 ENCOUNTER — Encounter: Payer: Self-pay | Admitting: Certified Registered Nurse Anesthetist

## 2017-05-17 ENCOUNTER — Inpatient Hospital Stay: Payer: BLUE CROSS/BLUE SHIELD

## 2017-05-17 DIAGNOSIS — K8020 Calculus of gallbladder without cholecystitis without obstruction: Secondary | ICD-10-CM

## 2017-05-17 DIAGNOSIS — K8050 Calculus of bile duct without cholangitis or cholecystitis without obstruction: Secondary | ICD-10-CM

## 2017-05-17 DIAGNOSIS — K8071 Calculus of gallbladder and bile duct without cholecystitis with obstruction: Secondary | ICD-10-CM

## 2017-05-17 HISTORY — PX: CHOLECYSTECTOMY: SHX55

## 2017-05-17 LAB — COMPREHENSIVE METABOLIC PANEL
ALT: 267 U/L — ABNORMAL HIGH (ref 14–54)
AST: 67 U/L — AB (ref 15–41)
Albumin: 3.7 g/dL (ref 3.5–5.0)
Alkaline Phosphatase: 168 U/L — ABNORMAL HIGH (ref 38–126)
Anion gap: 5 (ref 5–15)
BILIRUBIN TOTAL: 1.2 mg/dL (ref 0.3–1.2)
BUN: 7 mg/dL (ref 6–20)
CHLORIDE: 107 mmol/L (ref 101–111)
CO2: 24 mmol/L (ref 22–32)
CREATININE: 0.69 mg/dL (ref 0.44–1.00)
Calcium: 8.7 mg/dL — ABNORMAL LOW (ref 8.9–10.3)
Glucose, Bld: 90 mg/dL (ref 65–99)
POTASSIUM: 3.7 mmol/L (ref 3.5–5.1)
Sodium: 136 mmol/L (ref 135–145)
TOTAL PROTEIN: 6.1 g/dL — AB (ref 6.5–8.1)

## 2017-05-17 LAB — SURGICAL PCR SCREEN
MRSA, PCR: POSITIVE — AB
STAPHYLOCOCCUS AUREUS: POSITIVE — AB

## 2017-05-17 SURGERY — LAPAROSCOPIC CHOLECYSTECTOMY WITH INTRAOPERATIVE CHOLANGIOGRAM
Anesthesia: General | Site: Abdomen | Wound class: Clean Contaminated

## 2017-05-17 MED ORDER — DEXAMETHASONE SODIUM PHOSPHATE 10 MG/ML IJ SOLN
INTRAMUSCULAR | Status: DC | PRN
  Administered 2017-05-17: 10 mg via INTRAVENOUS

## 2017-05-17 MED ORDER — IOTHALAMATE MEGLUMINE 60 % INJ SOLN
INTRAMUSCULAR | Status: DC | PRN
  Administered 2017-05-17: 20 mL

## 2017-05-17 MED ORDER — ONDANSETRON HCL 4 MG/2ML IJ SOLN
INTRAMUSCULAR | Status: AC
  Filled 2017-05-17: qty 2

## 2017-05-17 MED ORDER — FENTANYL CITRATE (PF) 100 MCG/2ML IJ SOLN
INTRAMUSCULAR | Status: AC
  Filled 2017-05-17: qty 4

## 2017-05-17 MED ORDER — FENTANYL CITRATE (PF) 100 MCG/2ML IJ SOLN
25.0000 ug | INTRAMUSCULAR | Status: DC | PRN
  Administered 2017-05-17 (×4): 25 ug via INTRAVENOUS

## 2017-05-17 MED ORDER — LIDOCAINE HCL (PF) 2 % IJ SOLN
INTRAMUSCULAR | Status: AC
  Filled 2017-05-17: qty 10

## 2017-05-17 MED ORDER — BUPIVACAINE-EPINEPHRINE (PF) 0.25% -1:200000 IJ SOLN
INTRAMUSCULAR | Status: AC
  Filled 2017-05-17: qty 30

## 2017-05-17 MED ORDER — CEFAZOLIN SODIUM-DEXTROSE 2-3 GM-%(50ML) IV SOLR
INTRAVENOUS | Status: DC | PRN
  Administered 2017-05-17: 2 g via INTRAVENOUS

## 2017-05-17 MED ORDER — MIDAZOLAM HCL 2 MG/2ML IJ SOLN
INTRAMUSCULAR | Status: DC | PRN
  Administered 2017-05-17: 2 mg via INTRAVENOUS

## 2017-05-17 MED ORDER — LACTATED RINGERS IV SOLN
INTRAVENOUS | Status: DC | PRN
  Administered 2017-05-17: 16:00:00 via INTRAVENOUS

## 2017-05-17 MED ORDER — FENTANYL CITRATE (PF) 100 MCG/2ML IJ SOLN
INTRAMUSCULAR | Status: AC
  Filled 2017-05-17: qty 2

## 2017-05-17 MED ORDER — PROPOFOL 10 MG/ML IV BOLUS
INTRAVENOUS | Status: DC | PRN
  Administered 2017-05-17: 200 mg via INTRAVENOUS

## 2017-05-17 MED ORDER — CHLORHEXIDINE GLUCONATE CLOTH 2 % EX PADS: 6.0000 | MEDICATED_PAD | Freq: Once | CUTANEOUS | Status: DC

## 2017-05-17 MED ORDER — CHLORHEXIDINE GLUCONATE CLOTH 2 % EX PADS
6.0000 | MEDICATED_PAD | Freq: Once | CUTANEOUS | Status: AC
  Administered 2017-05-17: 6 via TOPICAL

## 2017-05-17 MED ORDER — FENTANYL CITRATE (PF) 100 MCG/2ML IJ SOLN
INTRAMUSCULAR | Status: DC | PRN
  Administered 2017-05-17: 50 ug via INTRAVENOUS
  Administered 2017-05-17: 100 ug via INTRAVENOUS
  Administered 2017-05-17 (×3): 50 ug via INTRAVENOUS

## 2017-05-17 MED ORDER — MIDAZOLAM HCL 2 MG/2ML IJ SOLN
INTRAMUSCULAR | Status: AC
  Filled 2017-05-17: qty 2

## 2017-05-17 MED ORDER — KETOROLAC TROMETHAMINE 30 MG/ML IJ SOLN
30.0000 mg | Freq: Four times a day (QID) | INTRAMUSCULAR | Status: DC
  Administered 2017-05-18 (×3): 30 mg via INTRAVENOUS
  Filled 2017-05-17 (×3): qty 1

## 2017-05-17 MED ORDER — HYDROMORPHONE HCL 1 MG/ML IJ SOLN: 0.5000 mg | INTRAMUSCULAR | Status: DC | PRN

## 2017-05-17 MED ORDER — PROPOFOL 10 MG/ML IV BOLUS
INTRAVENOUS | Status: AC
  Filled 2017-05-17: qty 20

## 2017-05-17 MED ORDER — MUPIROCIN 2 % EX OINT: 1.0000 "application " | TOPICAL_OINTMENT | Freq: Two times a day (BID) | CUTANEOUS | Status: DC

## 2017-05-17 MED ORDER — ONDANSETRON HCL 4 MG/2ML IJ SOLN
INTRAMUSCULAR | Status: DC | PRN
  Administered 2017-05-17: 4 mg via INTRAVENOUS

## 2017-05-17 MED ORDER — ROCURONIUM BROMIDE 50 MG/5ML IV SOLN
INTRAVENOUS | Status: AC
  Filled 2017-05-17: qty 1

## 2017-05-17 MED ORDER — SUCCINYLCHOLINE CHLORIDE 20 MG/ML IJ SOLN
INTRAMUSCULAR | Status: DC | PRN
  Administered 2017-05-17: 100 mg via INTRAVENOUS

## 2017-05-17 MED ORDER — ONDANSETRON HCL 4 MG/2ML IJ SOLN: 4.0000 mg | Freq: Once | INTRAMUSCULAR | Status: DC | PRN

## 2017-05-17 MED ORDER — FENTANYL CITRATE (PF) 100 MCG/2ML IJ SOLN
INTRAMUSCULAR | Status: AC
  Administered 2017-05-17: 25 ug via INTRAVENOUS
  Filled 2017-05-17: qty 2

## 2017-05-17 MED ORDER — ROCURONIUM BROMIDE 100 MG/10ML IV SOLN
INTRAVENOUS | Status: DC | PRN
  Administered 2017-05-17: 30 mg via INTRAVENOUS

## 2017-05-17 MED ORDER — BUPIVACAINE HCL (PF) 0.5 % IJ SOLN
INTRAMUSCULAR | Status: AC
  Filled 2017-05-17: qty 30

## 2017-05-17 MED ORDER — MIDAZOLAM HCL 2 MG/2ML IJ SOLN
INTRAMUSCULAR | Status: AC
  Filled 2017-05-17: qty 4

## 2017-05-17 MED ORDER — DEXAMETHASONE SODIUM PHOSPHATE 10 MG/ML IJ SOLN
INTRAMUSCULAR | Status: AC
  Filled 2017-05-17: qty 1

## 2017-05-17 MED ORDER — LIDOCAINE HCL (PF) 1 % IJ SOLN
INTRAMUSCULAR | Status: AC
  Filled 2017-05-17: qty 30

## 2017-05-17 MED ORDER — MUPIROCIN 2 % EX OINT
1.0000 "application " | TOPICAL_OINTMENT | Freq: Two times a day (BID) | CUTANEOUS | Status: DC
  Administered 2017-05-17: 1 via NASAL
  Filled 2017-05-17: qty 22

## 2017-05-17 MED ORDER — FENTANYL CITRATE (PF) 100 MCG/2ML IJ SOLN
INTRAMUSCULAR | Status: AC
Start: 2017-05-17 — End: ?
  Filled 2017-05-17: qty 2

## 2017-05-17 MED ORDER — SUGAMMADEX SODIUM 200 MG/2ML IV SOLN
INTRAVENOUS | Status: DC | PRN
  Administered 2017-05-17: 160 mg via INTRAVENOUS

## 2017-05-17 MED ORDER — BUPIVACAINE-EPINEPHRINE (PF) 0.25% -1:200000 IJ SOLN
INTRAMUSCULAR | Status: DC | PRN
  Administered 2017-05-17: 30 mL via PERINEURAL

## 2017-05-17 SURGICAL SUPPLY — 47 items
APPLIER CLIP 5 13 M/L LIGAMAX5 (MISCELLANEOUS) ×3
APPLIER CLIP ROT 10 11.4 M/L (STAPLE) ×3
BLADE SURG 15 STRL LF DISP TIS (BLADE) ×1 IMPLANT
BLADE SURG 15 STRL SS (BLADE) ×2
CATH CHOLANGI 4FR 420404F (CATHETERS) ×3 IMPLANT
CATH REDDICK CHOLANGI 4FR 50CM (CATHETERS) ×3 IMPLANT
CHLORAPREP W/TINT 26ML (MISCELLANEOUS) ×3 IMPLANT
CLIP APPLIE 5 13 M/L LIGAMAX5 (MISCELLANEOUS) ×1 IMPLANT
CLIP APPLIE ROT 10 11.4 M/L (STAPLE) ×1 IMPLANT
DECANTER SPIKE VIAL GLASS SM (MISCELLANEOUS) ×6 IMPLANT
DERMABOND ADVANCED (GAUZE/BANDAGES/DRESSINGS) ×2
DERMABOND ADVANCED .7 DNX12 (GAUZE/BANDAGES/DRESSINGS) ×1 IMPLANT
DRESSING SURGICEL FIBRLLR 1X2 (HEMOSTASIS) IMPLANT
DRSG SURGICEL FIBRILLAR 1X2 (HEMOSTASIS)
ELECT REM PT RETURN 9FT ADLT (ELECTROSURGICAL) ×3
ELECTRODE REM PT RTRN 9FT ADLT (ELECTROSURGICAL) ×1 IMPLANT
GLOVE BIO SURGEON STRL SZ7 (GLOVE) ×6 IMPLANT
GLOVE BIOGEL PI IND STRL 7.5 (GLOVE) ×2 IMPLANT
GLOVE BIOGEL PI INDICATOR 7.5 (GLOVE) ×4
GOWN STRL REUS W/ TWL LRG LVL3 (GOWN DISPOSABLE) ×3 IMPLANT
GOWN STRL REUS W/TWL LRG LVL3 (GOWN DISPOSABLE) ×6
GRASPER SUT TROCAR 14GX15 (MISCELLANEOUS) ×3 IMPLANT
IRRIGATION STRYKERFLOW (MISCELLANEOUS) IMPLANT
IRRIGATOR STRYKERFLOW (MISCELLANEOUS)
IV CATH ANGIO 12GX3 LT BLUE (NEEDLE) ×3 IMPLANT
IV NS 1000ML (IV SOLUTION)
IV NS 1000ML BAXH (IV SOLUTION) IMPLANT
KIT TURNOVER KIT A (KITS) ×3 IMPLANT
L-HOOK LAP DISP 36CM (ELECTROSURGICAL) ×3
LHOOK LAP DISP 36CM (ELECTROSURGICAL) ×1 IMPLANT
NEEDLE HYPO 22GX1.5 SAFETY (NEEDLE) ×3 IMPLANT
NEEDLE INSUFFLATION 14GA 120MM (NEEDLE) ×3 IMPLANT
NS IRRIG 1000ML POUR BTL (IV SOLUTION) ×3 IMPLANT
PACK LAP CHOLECYSTECTOMY (MISCELLANEOUS) ×3 IMPLANT
PENCIL ELECTRO HAND CTR (MISCELLANEOUS) ×3 IMPLANT
POUCH ENDO CATCH 10MM SPEC (MISCELLANEOUS) ×3 IMPLANT
POUCH SPECIMEN RETRIEVAL 10MM (ENDOMECHANICALS) ×3 IMPLANT
SCISSORS METZENBAUM CVD 33 (INSTRUMENTS) IMPLANT
SLEEVE ENDOPATH XCEL 5M (ENDOMECHANICALS) ×3 IMPLANT
SUT MNCRL AB 4-0 PS2 18 (SUTURE) ×3 IMPLANT
SUT VICRYL 0 UR6 27IN ABS (SUTURE) ×3 IMPLANT
SUT VICRYL AB 3-0 FS1 BRD 27IN (SUTURE) ×3 IMPLANT
SYR 20CC LL (SYRINGE) ×3 IMPLANT
TOWEL OR 17X26 4PK STRL BLUE (TOWEL DISPOSABLE) ×3 IMPLANT
TROCAR XCEL NON-BLD 11X100MML (ENDOMECHANICALS) ×3 IMPLANT
TROCAR XCEL NON-BLD 5MMX100MML (ENDOMECHANICALS) ×3 IMPLANT
TUBING INSUFFLATION (TUBING) ×3 IMPLANT

## 2017-05-17 NOTE — Transfer of Care (Signed)
Immediate Anesthesia Transfer of Care Note  Patient: Elizabeth Rush  Procedure(s) Performed: LAPAROSCOPIC CHOLECYSTECTOMY WITH INTRAOPERATIVE CHOLANGIOGRAM (N/A Abdomen)  Patient Location: PACU  Anesthesia Type:General  Level of Consciousness: awake and patient cooperative  Airway & Oxygen Therapy: Patient Spontanous Breathing and Patient connected to face mask oxygen  Post-op Assessment: Report given to RN and Post -op Vital signs reviewed and stable  Post vital signs: Reviewed and stable  Last Vitals:  Vitals Value Taken Time  BP 130/76 05/17/2017 11:26 PM  Temp    Pulse 113 05/17/2017 11:31 PM  Resp 15 05/17/2017 11:31 PM  SpO2 100 % 05/17/2017 11:31 PM  Vitals shown include unvalidated device data.  Last Pain:  Vitals:   05/17/17 1342  TempSrc: Oral  PainSc:          Complications: No apparent anesthesia complications

## 2017-05-17 NOTE — Op Note (Signed)
  Procedure Date:  05/17/2017  Pre-operative Diagnosis:  Choledocholithiasis  Post-operative Diagnosis:  Choledocholithiasis  Procedure:  Laparoscopic cholecystectomy with intraoperative cholangiogram  Surgeon:  Howie IllJose Luis Tahlia Deamer, MD  Anesthesia:  General endotracheal  Estimated Blood Loss:  10 ml  Specimens:  gallbladder  Complications:  None  Indications for Procedure:  This is a 22 y.o. female who presents with abdominal pain and workup revealing choledocholithiasis with elevated LFTs.  The benefits, complications, treatment options, and expected outcomes were discussed with the patient. The risks of bleeding, infection, recurrence of symptoms, failure to resolve symptoms, bile duct damage, bile duct leak, retained common bile duct stone, bowel injury, and need for further procedures were all discussed with the patient and she was willing to proceed.  Description of Procedure: The patient was correctly identified in the preoperative area and brought into the operating room.  The patient was placed supine with VTE prophylaxis in place.  Appropriate time-outs were performed.  Anesthesia was induced and the patient was intubated.  Appropriate antibiotics were infused.  The abdomen was prepped and draped in a sterile fashion. An infraumbilical incision was made. A cutdown technique was used to enter the abdominal cavity without injury, and a Hasson trocar was inserted.  Pneumoperitoneum was obtained with appropriate opening pressures.  A 5-mm port was placed in the subxiphoid area and two 5-mm ports were placed in the right upper quadrant under direct visualization.  The gallbladder was identified.  The fundus was grasped and retracted cephalad.  Adhesions were lysed bluntly and with electrocautery. The infundibulum was grasped and retracted laterally, exposing the peritoneum overlying the gallbladder.  This was incised with electrocautery and extended on either side of the gallbladder.  The  cystic duct and cystic artery were clearly identified and bluntly dissected.  The cystic duct was clipped once distally and a ductotomy was created.  A 14Fr angiocath was placed in the right upper quadrant and the cholangiogram catheter passed through it.  Using Maryland forceps, the catheter was introduced into the ductotomy and secured with a clip.  The C-arm was then brought into the field and a cholangiogram was performed showing a patent cystic duct and common bile duct, with no filling defects and contrast reaching the duodenum.  After completion of the cholangiogram, the catheter was removed and the cystic duct was clipped twice proximally and cut in between.  The cystic artery was clipped and cut in same fashion.  The gallbladder was taken from the gallbladder fossa in a retrograde fashion with electrocautery. The gallbladder was placed in an Endocatch bag and brought out via the umbilical incision. The liver bed was inspected and any bleeding was controlled with electrocautery. The right upper quadrant was then inspected again revealing intact clips, no bleeding, and no ductal injury.  The 5 mm ports were removed under direct visualization and the Hasson trocar was removed.  The fascial opening was closed using 0 vicryl suture.  Local anesthetic was infused in all incisions and the incisions were closed with 4-0 Monocryl.  The wounds were cleaned and sealed with DermaBond.  The patient was emerged from anesthesia and extubated and brought to the recovery room for further management.  The patient tolerated the procedure well and all counts were correct at the end of the case.   Howie IllJose Luis Ladona Rosten, MD

## 2017-05-17 NOTE — Anesthesia Procedure Notes (Signed)
Procedure Name: Intubation Date/Time: 05/17/2017 9:40 PM Performed by: Waldo LaineJustis, Adabella Stanis, CRNA Pre-anesthesia Checklist: Patient identified, Patient being monitored, Timeout performed, Emergency Drugs available and Suction available Patient Re-evaluated:Patient Re-evaluated prior to induction Oxygen Delivery Method: Circle system utilized Preoxygenation: Pre-oxygenation with 100% oxygen Induction Type: IV induction Ventilation: Mask ventilation without difficulty Laryngoscope Size: Miller and 2 Grade View: Grade I Tube type: Oral Tube size: 7.0 mm Number of attempts: 1 Airway Equipment and Method: Stylet Placement Confirmation: ETT inserted through vocal cords under direct vision,  positive ETCO2 and breath sounds checked- equal and bilateral Secured at: 20 cm Tube secured with: Tape Dental Injury: Teeth and Oropharynx as per pre-operative assessment

## 2017-05-17 NOTE — Anesthesia Post-op Follow-up Note (Signed)
Anesthesia QCDR form completed.        

## 2017-05-17 NOTE — Anesthesia Preprocedure Evaluation (Signed)
Anesthesia Evaluation  Patient identified by MRN, date of birth, ID band Patient awake    Reviewed: Allergy & Precautions, NPO status , Patient's Chart, lab work & pertinent test results  Airway Mallampati: II       Dental  (+) Teeth Intact   Pulmonary neg pulmonary ROS,    Pulmonary exam normal        Cardiovascular negative cardio ROS Normal cardiovascular exam     Neuro/Psych Depression negative neurological ROS     GI/Hepatic negative GI ROS, Neg liver ROS,   Endo/Other  negative endocrine ROS  Renal/GU negative Renal ROS  negative genitourinary   Musculoskeletal negative musculoskeletal ROS (+)   Abdominal Normal abdominal exam  (+)   Peds negative pediatric ROS (+)  Hematology negative hematology ROS (+)   Anesthesia Other Findings Past Medical History: No date: Medical history non-contributory No date: Seasonal affective disorder (HCC)  Reproductive/Obstetrics                             Anesthesia Physical Anesthesia Plan  ASA: II  Anesthesia Plan: General   Post-op Pain Management:    Induction: Intravenous  PONV Risk Score and Plan:   Airway Management Planned: Oral ETT  Additional Equipment:   Intra-op Plan:   Post-operative Plan: Extubation in OR  Informed Consent: I have reviewed the patients History and Physical, chart, labs and discussed the procedure including the risks, benefits and alternatives for the proposed anesthesia with the patient or authorized representative who has indicated his/her understanding and acceptance.   Dental advisory given  Plan Discussed with: CRNA and Surgeon  Anesthesia Plan Comments:         Anesthesia Quick Evaluation

## 2017-05-18 ENCOUNTER — Encounter: Payer: Self-pay | Admitting: Surgery

## 2017-05-18 MED ORDER — HYDROCODONE-ACETAMINOPHEN 5-325 MG PO TABS: 1.0000 | ORAL_TABLET | ORAL | 0 refills | Status: DC | PRN

## 2017-05-18 NOTE — Progress Notes (Signed)
Elizabeth Rush to be D/C'd home per MD order.  Discussed prescriptions and follow up appointments with the patient. Prescriptions given to patient, medication list explained in detail. Pt verbalized understanding.  Allergies as of 05/18/2017      Reactions   Corn Syrup [glucose] Hives, Diarrhea, Nausea And Vomiting      Medication List    TAKE these medications   b complex vitamins tablet Take 1 tablet by mouth daily.   HYDROcodone-acetaminophen 5-325 MG tablet Commonly known as:  NORCO/VICODIN Take 1-2 tablets by mouth every 4 (four) hours as needed for severe pain.       Vitals:   05/18/17 0408 05/18/17 1155  BP: 109/71 112/69  Pulse: 89 88  Resp: 18 18  Temp: 97.9 F (36.6 C) 98 F (36.7 C)  SpO2: 98% 98%    Skin clean, dry and intact without evidence of skin break down, no evidence of skin tears noted. IV catheter discontinued intact. Site without signs and symptoms of complications. Dressing and pressure applied. Pt denies pain at this time. No complaints noted.  An After Visit Summary was printed and given to the patient. Patient escorted via WC, and D/C home via private auto.  Elizabeth Rush Elizabeth Rush

## 2017-05-18 NOTE — Discharge Instructions (Signed)
In addition to included general post-operative instructions for Laparoscopic Cholecystectomy,  Diet: Resume home heart healthy diet.   Activity: No heavy lifting >20 pounds (children, pets, laundry, garbage) or strenuous activity until follow-up, but light activity and walking are encouraged. Do not drive or drink alcohol if taking narcotic pain medications.  Wound care: 2 days after surgery (Friday, 4/19), may shower/get incision wet with soapy water and pat dry (do not rub incisions), but no baths or submerging incision underwater until follow-up.   Medications: Resume all home medications. For mild to moderate pain: acetaminophen (Tylenol) or ibuprofen/naproxen (if no kidney disease). Combining Tylenol with alcohol can substantially increase your risk of causing liver disease. Narcotic pain medications, if prescribed, can be used for severe pain, though may cause nausea, constipation, and drowsiness. Do not combine Tylenol and Percocet (or similar) within a 6 hour period as Percocet (and similar) contain(s) Tylenol. If you do not need the narcotic pain medication, you do not need to fill the prescription.  Call office [phone]) at any time if any questions, worsening pain, fevers/chills, bleeding, drainage from incision site, or other concerns.

## 2017-05-19 LAB — SURGICAL PATHOLOGY

## 2017-05-21 NOTE — Discharge Summary (Signed)
Physician Discharge Summary  Patient ID: Elizabeth Rush MRN: [id number] DOB/AGE: 22/09/1995 22 y.o.  Admit date: 05/15/2017 Discharge date: 05/18/2017  Admission Diagnoses:  Discharge Diagnoses:  Active Problems:   Choledocholithiasis   Gall stones   Discharged Condition: good  Hospital Course: 22 y.o. female [facility] ED for abdominal pain. Workup was found to be significant for ultrasound imaging demonstrating cholelithiasis with upper-normal diameter CBD and t bili 4.1. MRCP demonstrated no choledocholithiasis, and t bili normalized. Informed consent was obtained and documented, and patient underwent uneventful laparoscopic cholecystectomy with intra-operative cholangiogram ([place], 05/17/2017).  Post-operatively, patient's pain improved/resolved and advancement of patient's diet and ambulation were well-tolerated. The remainder of patient's hospital course was essentially unremarkable, and discharge planning was initiated accordingly with patient safely able to be discharged home with appropriate discharge instructions, pain control, and outpatient surgical follow-up after all of her and her husband's questions were answered to their expressed satisfaction.  Consults: GI and general surgery  Significant Diagnostic Studies: labs: T bili: 4.1 (05/15/2017) --> 1.2 (05/17/2017) and radiology: Ultrasound: cholelithiasis with upper-normal CBD diameter and MRCP: tiny gallstones and biliary sludge without CBD dilation or choledocholithiasis  Treatments: IV hydration, antibiotics: Ancef and surgery: laparoscopic cholecystectomy with intra-operative cholangiogram (Piscoya, 05/17/2017)  Discharge Exam: Blood pressure 112/69, pulse 88, temperature 98 F (36.7 C), temperature source Oral, resp. rate 18, height 5\' 6"  (1.676 m), weight 171 lb (77.6 kg), SpO2 98 %, not currently breastfeeding. General appearance: alert, cooperative, appears stated age and no distress GI: abdomen soft and  non-distended with minimal peri-incisional tenderness to palpation, incisions well-approximated without surrounding erythema or drainage  Disposition:    Allergies as of 05/18/2017      Reactions   Corn Syrup [glucose] Hives, Diarrhea, Nausea And Vomiting      Medication List    TAKE these medications   b complex vitamins tablet Take 1 tablet by mouth daily.   HYDROcodone-acetaminophen 5-325 MG tablet Commonly known as:  NORCO/VICODIN Take 1-2 tablets by mouth every 4 (four) hours as needed for severe pain.      Follow-up Information    Leafy RoPabon, Diego F, MD. Go on 06/01/2017.   Specialty:  General Surgery Why:  @1 :45p Contact information: [address] [place] [address] [phone]           Signed: Ancil LinseyJason Evan Davis 05/21/2017, 11:24 AM

## 2017-05-23 ENCOUNTER — Ambulatory Visit (INDEPENDENT_AMBULATORY_CARE_PROVIDER_SITE_OTHER): Payer: No Typology Code available for payment source | Admitting: Certified Nurse Midwife

## 2017-05-23 NOTE — Patient Instructions (Signed)
Preventive Care 18-39 Years, Female Preventive care refers to lifestyle choices and visits with your health care provider that can promote health and wellness. What does preventive care include?  A yearly physical exam. This is also called an annual well check.  Dental exams once or twice a year.  Routine eye exams. Ask your health care provider how often you should have your eyes checked.  Personal lifestyle choices, including: ? Daily care of your teeth and gums. ? Regular physical activity. ? Eating a healthy diet. ? Avoiding tobacco and drug use. ? Limiting alcohol use. ? Practicing safe sex. ? Taking vitamin and mineral supplements as recommended by your health care provider. What happens during an annual well check? The services and screenings done by your health care provider during your annual well check will depend on your age, overall health, lifestyle risk factors, and family history of disease. Counseling Your health care provider may ask you questions about your:  Alcohol use.  Tobacco use.  Drug use.  Emotional well-being.  Home and relationship well-being.  Sexual activity.  Eating habits.  Work and work Statistician.  Method of birth control.  Menstrual cycle.  Pregnancy history.  Screening You may have the following tests or measurements:  Height, weight, and BMI.  Diabetes screening. This is done by checking your blood sugar (glucose) after you have not eaten for a while (fasting).  Blood pressure.  Lipid and cholesterol levels. These may be checked every 5 years starting at age 66.  Skin check.  Hepatitis C blood test.  Hepatitis B blood test.  Sexually transmitted disease (STD) testing.  BRCA-related cancer screening. This may be done if you have a family history of breast, ovarian, tubal, or peritoneal cancers.  Pelvic exam and Pap test. This may be done every 3 years starting at age 40. Starting at age 59, this may be done every 5  years if you have a Pap test in combination with an HPV test.  Discuss your test results, treatment options, and if necessary, the need for more tests with your health care provider. Vaccines Your health care provider may recommend certain vaccines, such as:  Influenza vaccine. This is recommended every year.  Tetanus, diphtheria, and acellular pertussis (Tdap, Td) vaccine. You may need a Td booster every 10 years.  Varicella vaccine. You may need this if you have not been vaccinated.  HPV vaccine. If you are 69 or younger, you may need three doses over 6 months.  Measles, mumps, and rubella (MMR) vaccine. You may need at least one dose of MMR. You may also need a second dose.  Pneumococcal 13-valent conjugate (PCV13) vaccine. You may need this if you have certain conditions and were not previously vaccinated.  Pneumococcal polysaccharide (PPSV23) vaccine. You may need one or two doses if you smoke cigarettes or if you have certain conditions.  Meningococcal vaccine. One dose is recommended if you are age 27-21 years and a first-year college student living in a residence hall, or if you have one of several medical conditions. You may also need additional booster doses.  Hepatitis A vaccine. You may need this if you have certain conditions or if you travel or work in places where you may be exposed to hepatitis A.  Hepatitis B vaccine. You may need this if you have certain conditions or if you travel or work in places where you may be exposed to hepatitis B.  Haemophilus influenzae type b (Hib) vaccine. You may need this if  you have certain risk factors.  Talk to your health care provider about which screenings and vaccines you need and how often you need them. This information is not intended to replace advice given to you by your health care provider. Make sure you discuss any questions you have with your health care provider. Document Released: 03/16/2001 Document Revised: 10/08/2015  Document Reviewed: 11/19/2014 Elsevier Interactive Patient Education  [employer].

## 2017-05-23 NOTE — Progress Notes (Signed)
Pt is here for a post partum visit. Had a cholecystectomy 05/18/17. Is bottle feeding. Has not had a period. Does not want bc. Has not resumed intercourse. Screening 3

## 2017-05-23 NOTE — Progress Notes (Signed)
Subjective:    Elizabeth PyleKimberly Rush is a 22 y.o. [id number] Caucasian female who presents for a postpartum visit. She is 6 weeks postpartum following a spontaneous vaginal delivery at 39.3 gestational weeks. Anesthesia: none. I have fully reviewed the prenatal and intrapartum course. Postpartum course has been WNL. Baby's course has been WNL. Baby is feeding by bottle. Bleeding no bleeding. Bowel function is normal. Bladder function is normal. Patient is not sexually active. Last sexual activity: prior to delivery. Contraception method is condoms. Postpartum depression screening: negative. Score 3.  Last pap a few yrs ago and was negative  The following portions of the patient's history were reviewed and updated as appropriate: allergies, current medications, past medical history, past surgical history and problem list.  Review of Systems Pertinent items are noted in HPI.   Vitals:   05/23/17 0910  BP: 105/74  Pulse: 87  Weight: 164 lb 5 oz (74.5 kg)  Height: 5\' 6"  (1.676 m)   No LMP recorded.  Objective:   General:  alert, cooperative and no distress   Breasts:  deferred, no complaints  Lungs: clear to auscultation bilaterally  Heart:  regular rate and rhythm  Abdomen: soft, nontender   Vulva: normal  Vagina: normal vagina  Cervix:  closed  Corpus: Well-involuted  Adnexa:  Non-palpable  Rectal Exam: no hemorrhoids        Assessment:   Postpartum exam 6 wks s/p SVD bottlefeeding Depression screening Contraception counseling   Plan:  : condoms. Discussed BC options. She is not decided at this time and will use condoms.  Follow up in: 6 months for annual exam or earlier if needed  Doreene BurkeAnnie Angelene Rome, CNM

## 2017-05-25 NOTE — Anesthesia Postprocedure Evaluation (Signed)
Anesthesia Post Note  Patient: Elizabeth Rush  Procedure(s) Performed: LAPAROSCOPIC CHOLECYSTECTOMY WITH INTRAOPERATIVE CHOLANGIOGRAM (N/A Abdomen)  Patient location during evaluation: PACU Anesthesia Type: General Level of consciousness: awake and alert and oriented Pain management: pain level controlled Vital Signs Assessment: post-procedure vital signs reviewed and stable Respiratory status: spontaneous breathing Cardiovascular status: blood pressure returned to baseline Anesthetic complications: no     Last Vitals:  Vitals:   05/18/17 0408 05/18/17 1155  BP: 109/71 112/69  Pulse: 89 88  Resp: 18 18  Temp: 36.6 C 36.7 C  SpO2: 98% 98%    Last Pain:  Vitals:   05/18/17 1306  TempSrc:   PainSc: 7                  Cary Lothrop

## 2017-06-01 ENCOUNTER — Encounter: Payer: Self-pay | Admitting: Surgery

## 2017-06-02 ENCOUNTER — Ambulatory Visit (INDEPENDENT_AMBULATORY_CARE_PROVIDER_SITE_OTHER): Payer: No Typology Code available for payment source | Admitting: Surgery

## 2017-06-02 ENCOUNTER — Encounter: Payer: Self-pay | Admitting: Surgery

## 2017-06-02 VITALS — BP 121/84 | HR 86 | Temp 97.4°F | Ht 66.0 in | Wt 170.0 lb

## 2017-06-02 DIAGNOSIS — Z09 Encounter for follow-up examination after completed treatment for conditions other than malignant neoplasm: Secondary | ICD-10-CM

## 2017-06-02 NOTE — Patient Instructions (Signed)
GENERAL POST-OPERATIVE PATIENT INSTRUCTIONS   WOUND CARE INSTRUCTIONS:  Keep a dry clean dressing on the wound if there is drainage. The initial bandage may be removed after 24 hours.  Once the wound has quit draining you may leave it open to air.  If clothing rubs against the wound or causes irritation and the wound is not draining you may cover it with a dry dressing during the daytime.  Try to keep the wound dry and avoid ointments on the wound unless directed to do so.  If the wound becomes bright red and painful or starts to drain infected material that is not clear, please contact your physician immediately.  If the wound is mildly pink and has a thick firm ridge underneath it, this is normal, and is referred to as a healing ridge.  This will resolve over the next 4-6 weeks.  BATHING: You may shower if you have been informed of this by your surgeon. However, Please do not submerge in a tub, hot tub, or pool until incisions are completely sealed or have been told by your surgeon that you may do so.  DIET:  You may eat any foods that you can tolerate.  It is a good idea to eat a high fiber diet and take in plenty of fluids to prevent constipation.  If you do become constipated you may want to take a mild laxative or take ducolax tablets on a daily basis until your bowel habits are regular.  Constipation can be very uncomfortable, along with straining, after recent surgery.  ACTIVITY:  You are encouraged to cough and deep breath or use your incentive spirometer if you were given one, every 15-30 minutes when awake.  This will help prevent respiratory complications and low grade fevers post-operatively if you had a general anesthetic.  You may want to hug a pillow when coughing and sneezing to add additional support to the surgical area, if you had abdominal or chest surgery, which will decrease pain during these times.  You are encouraged to walk and engage in light activity for the next two weeks.  You  should not lift more than 20 pounds, until 06/28/2017 as it could put you at increased risk for complications.  Twenty pounds is roughly equivalent to a plastic bag of groceries. At that time- Listen to your body when lifting, if you have pain when lifting, stop and then try again in a few days. Soreness after doing exercises or activities of daily living is normal as you get back in to your normal routine.  MEDICATIONS:  Try to take narcotic medications and anti-inflammatory medications, such as tylenol, ibuprofen, naprosyn, etc., with food.  This will minimize stomach upset from the medication.  Should you develop nausea and vomiting from the pain medication, or develop a rash, please discontinue the medication and contact your physician.  You should not drive, make important decisions, or operate machinery when taking narcotic pain medication.  SUNBLOCK Use sun block to incision area over the next year if this area will be exposed to sun. This helps decrease scarring and will allow you avoid a permanent darkened area over your incision.  QUESTIONS:  Please feel free to call our office if you have any questions, and we will be glad to assist you. [phone]    

## 2017-06-02 NOTE — Progress Notes (Signed)
S/p lap chole by Dr. Aleen Campi Doing great No fevers, no chills + PO, no pain Path d/w pt  PE NAD Abd: soft, incisions c/d/i, no infection  A/P Doing well No complications RTC prn No heavy lifting

## 2017-08-03 ENCOUNTER — Encounter: Payer: Self-pay | Admitting: Certified Nurse Midwife

## 2017-08-03 ENCOUNTER — Ambulatory Visit (INDEPENDENT_AMBULATORY_CARE_PROVIDER_SITE_OTHER): Payer: No Typology Code available for payment source | Admitting: Certified Nurse Midwife

## 2017-08-03 VITALS — BP 114/71 | HR 83 | Ht 66.0 in | Wt 179.0 lb

## 2017-08-03 DIAGNOSIS — F411 Generalized anxiety disorder: Secondary | ICD-10-CM

## 2017-08-03 DIAGNOSIS — R5383 Other fatigue: Secondary | ICD-10-CM | POA: Diagnosis not present

## 2017-08-03 NOTE — Progress Notes (Signed)
Pt is here with c/o depression and anxiety.Screening 20

## 2017-08-03 NOTE — Patient Instructions (Signed)
Postpartum Depression and Baby Blues The postpartum period begins right after the birth of a baby. During this time, there is often a great amount of joy and excitement. It is also a time of many changes in the life of the parents. Regardless of how many times a mother gives birth, each child brings new challenges and dynamics to the family. It is not unusual to have feelings of excitement along with confusing shifts in moods, emotions, and thoughts. All mothers are at risk of developing postpartum depression or the "baby blues." These mood changes can occur right after giving birth, or they may occur many months after giving birth. The baby blues or postpartum depression can be mild or severe. Additionally, postpartum depression can go away rather quickly, or it can be a long-term condition. What are the causes? Raised hormone levels and the rapid drop in those levels are thought to be a main cause of postpartum depression and the baby blues. A number of hormones change during and after pregnancy. Estrogen and progesterone usually decrease right after the delivery of your baby. The levels of thyroid hormone and various cortisol steroids also rapidly drop. Other factors that play a role in these mood changes include major life events and genetics. What increases the risk? If you have any of the following risks for the baby blues or postpartum depression, know what symptoms to watch out for during the postpartum period. Risk factors that may increase the likelihood of getting the baby blues or postpartum depression include:  Having a personal or family history of depression.  Having depression while being pregnant.  Having premenstrual mood issues or mood issues related to oral contraceptives.  Having a lot of life stress.  Having marital conflict.  Lacking a social support network.  Having a baby with special needs.  Having health problems, such as diabetes.  What are the signs or  symptoms? Symptoms of baby blues include:  Brief changes in mood, such as going from extreme happiness to sadness.  Decreased concentration.  Difficulty sleeping.  Crying spells, tearfulness.  Irritability.  Anxiety.  Symptoms of postpartum depression typically begin within the first month after giving birth. These symptoms include:  Difficulty sleeping or excessive sleepiness.  Marked weight loss.  Agitation.  Feelings of worthlessness.  Lack of interest in activity or food.  Postpartum psychosis is a very serious condition and can be dangerous. Fortunately, it is rare. Displaying any of the following symptoms is cause for immediate medical attention. Symptoms of postpartum psychosis include:  Hallucinations and delusions.  Bizarre or disorganized behavior.  Confusion or disorientation.  How is this diagnosed? A diagnosis is made by an evaluation of your symptoms. There are no medical or lab tests that lead to a diagnosis, but there are various questionnaires that a health care provider may use to identify those with the baby blues, postpartum depression, or psychosis. Often, a screening tool called the Edinburgh Postnatal Depression Scale is used to diagnose depression in the postpartum period. How is this treated? The baby blues usually goes away on its own in 1-2 weeks. Social support is often all that is needed. You will be encouraged to get adequate sleep and rest. Occasionally, you may be given medicines to help you sleep. Postpartum depression requires treatment because it can last several months or longer if it is not treated. Treatment may include individual or group therapy, medicine, or both to address any social, physiological, and psychological factors that may play a role in the   depression. Regular exercise, a healthy diet, rest, and social support may also be strongly recommended. Postpartum psychosis is more serious and needs treatment right away.  Hospitalization is often needed. Follow these instructions at home:  Get as much rest as you can. Nap when the baby sleeps.  Exercise regularly. Some women find yoga and walking to be beneficial.  Eat a balanced and nourishing diet.  Do little things that you enjoy. Have a cup of tea, take a bubble bath, read your favorite magazine, or listen to your favorite music.  Avoid alcohol.  Ask for help with household chores, cooking, grocery shopping, or running errands as needed. Do not try to do everything.  Talk to people close to you about how you are feeling. Get support from your partner, family members, friends, or other new moms.  Try to stay positive in how you think. Think about the things you are grateful for.  Do not spend a lot of time alone.  Only take over-the-counter or prescription medicine as directed by your health care provider.  Keep all your postpartum appointments.  Let your health care provider know if you have any concerns. Contact a health care provider if: You are having a reaction to or problems with your medicine. Get help right away if:  You have suicidal feelings.  You think you may harm the baby or someone else. This information is not intended to replace advice given to you by your health care provider. Make sure you discuss any questions you have with your health care provider. Document Released: 10/23/2003 Document Revised: 06/26/2015 Document Reviewed: 10/30/2012 Elsevier Interactive Patient Education  2017 Elsevier Inc.  

## 2017-08-03 NOTE — Progress Notes (Signed)
GYN ENCOUNTER NOTE  Subjective:       Elizabeth Rush is a 22 y.o. [phone] female is here for evaluation of the following issues:  1. Depression and anxiety. Pt states that she has been having this feeling of impending doom. She states that she has the feeling like she is going to die. She states she is sleeping more and stays in her room a lot. She denies desire to hurt herself or her baby. She admits to being easily agitated and feeling like she has no energy. She denies a change in her diet. She denies that she is not getting enough sleep stating that if anything she is sleeping more.  She admits to having a history of having a few panic attacks as a teenager.      Gynecologic History Patient's last menstrual period was 07/21/2017 (exact date). Contraception: condoms Last Pap: a few yrs ago per pt. Results were: neg Last mammogram: n/a.  Obstetric History OB History  Gravida Para Term Preterm AB Living  2 2 2     2   SAB TAB Ectopic Multiple Live Births        0 2    # Outcome Date GA Lbr Len/2nd Weight Sex Delivery Anes PTL Lv  2 Term 04/08/17 [redacted]w[redacted]d  6 lb 15.5 oz (3.16 kg) M Vag-Spont None  LIV  1 Term 2014 [redacted]w[redacted]d  6 lb (2.722 kg) F Vag-Spont  N LIV    Past Medical History:  Diagnosis Date  . Medical history non-contributory   . Seasonal affective disorder [facility])     Past Surgical History:  Procedure Laterality Date  . CHOLECYSTECTOMY N/A 05/17/2017   Procedure: LAPAROSCOPIC CHOLECYSTECTOMY WITH INTRAOPERATIVE CHOLANGIOGRAM;  Surgeon: Henrene Dodge, MD;  Location: [facility] ORS;  Service: General;  Laterality: N/A;  . NO PAST SURGERIES      Current Outpatient Medications on File Prior to Visit  Medication Sig Dispense Refill  . vitamin B-12 (CYANOCOBALAMIN) 100 MCG tablet Take 100 mcg by mouth daily.     No current facility-administered medications on file prior to visit.     Allergies  Allergen Reactions  . Corn Syrup [Glucose] Hives, Diarrhea and Nausea And Vomiting     Social History   Socioeconomic History  . Marital status: Married    Spouse name: Not on file  . Number of children: Not on file  . Years of education: Not on file  . Highest education level: Not on file  Occupational History  . Not on file  Social Needs  . Financial resource strain: Not on file  . Food insecurity:    Worry: Not on file    Inability: Not on file  . Transportation needs:    Medical: Not on file    Non-medical: Not on file  Tobacco Use  . Smoking status: Never Smoker  . Smokeless tobacco: Never Used  Substance and Sexual Activity  . Alcohol use: No    Comment: occas  . Drug use: No  . Sexual activity: Not Currently    Birth control/protection: None    Comment: still deciding   Lifestyle  . Physical activity:    Days per week: Not on file    Minutes per session: Not on file  . Stress: Not on file  Relationships  . Social connections:    Talks on phone: Not on file    Gets together: Not on file    Attends religious service: Not on file    Active member of  club or organization: Not on file    Attends meetings of clubs or organizations: Not on file    Relationship status: Not on file  . Intimate partner violence:    Fear of current or ex partner: Not on file    Emotionally abused: Not on file    Physically abused: Not on file    Forced sexual activity: Not on file  Other Topics Concern  . Not on file  Social History Narrative  . Not on file    Family History  Problem Relation Age of Onset  . Thyroid disease Mother   . Cancer Maternal Grandfather     The following portions of the patient's history were reviewed and updated as appropriate: allergies, current medications, past family history, past medical history, past social history, past surgical history and problem list.  Review of Systems Review of Systems - Negative except as mentioned in HPI Review of Systems - General ROS: negative for - chills, fatigue, fever, hot flashes, malaise or  night sweats Hematological and Lymphatic ROS: negative for - bleeding problems or swollen lymph nodes Gastrointestinal ROS: negative for - abdominal pain, blood in stools, change in bowel habits and nausea/vomiting Musculoskeletal ROS: negative for - joint pain, muscle pain or muscular weakness Genito-Urinary ROS: negative for - change in menstrual cycle, dysmenorrhea, dyspareunia, dysuria, genital discharge, genital ulcers, hematuria, incontinence, irregular/heavy menses, nocturia or pelvic pain  Objective:   BP 114/71   Pulse 83   Ht 5\' 6"  (1.676 m)   Wt 179 lb (81.2 kg)   LMP 07/21/2017 (Exact Date)   Breastfeeding? No   BMI 28.89 kg/m  CONSTITUTIONAL: Well-developed, well-nourished female in no acute distress.  HENT:  Normocephalic, atraumatic.  NECK: Normal range of motion, supple, no masses.  Normal thyroid.  SKIN: Skin is warm and dry. No rash noted. Not diaphoretic. No erythema. No pallor. NEUROLGIC: Alert and oriented to person, place, and time.  PSYCHIATRIC: anxious, tearful. Normal judgment and thought content. CARDIOVASCULAR:Not Examined RESPIRATORY: Not Examined BREASTS: Not Examined ABDOMEN: Soft, non distended; Non tender.  No Organomegaly. PELVIC: not indicated MUSCULOSKELETAL: Normal range of motion. No tenderness.  No cyanosis, clubbing, or edema.  [id number]  Assessment:   Depression anxiety fatigue  Plan:  Labs: TSH, CBC, vitamin D & B , ferritin- return on Friday to have drawn Discussed use of medications and counseling . Referral to behavioral health. Information given to P.S.I. Red flag symptoms reviewed. Pt encouraged to go to E.D. If she has thoughts of hurting herself or baby. She verbalizes understanding and agrees to plan. Will follow up with labs.   I attest more than 50% of visit spent reviewing pt history, discussing plan of care, discussion of medications and counseling for treatment, and reviewing warning signs. Face to face time 15 min.

## 2017-08-05 ENCOUNTER — Other Ambulatory Visit: Payer: No Typology Code available for payment source

## 2017-08-05 DIAGNOSIS — R5383 Other fatigue: Secondary | ICD-10-CM

## 2017-08-14 LAB — THYROID PANEL WITH TSH
FREE THYROXINE INDEX: 1.8 (ref 1.2–4.9)
T3 Uptake Ratio: 26 % (ref 24–39)
T4 TOTAL: 6.9 ug/dL (ref 4.5–12.0)
TSH: 2.34 u[IU]/mL (ref 0.450–4.500)

## 2017-08-14 LAB — CBC
HEMOGLOBIN: 15.4 g/dL (ref 11.1–15.9)
Hematocrit: 42.5 % (ref 34.0–46.6)
MCH: 29.1 pg (ref 26.6–33.0)
MCHC: 36.2 g/dL — ABNORMAL HIGH (ref 31.5–35.7)
MCV: 80 fL (ref 79–97)
PLATELETS: 253 10*3/uL (ref 150–450)
RBC: 5.29 x10E6/uL — AB (ref 3.77–5.28)
RDW: 13.4 % (ref 12.3–15.4)
WBC: 6.1 10*3/uL (ref 3.4–10.8)

## 2017-08-14 LAB — VITAMIN D 1,25 DIHYDROXY
VITAMIN D 1, 25 (OH) TOTAL: 60 pg/mL
Vitamin D3 1, 25 (OH)2: 60 pg/mL

## 2017-08-14 LAB — VITAMIN B12: VITAMIN B 12: 447 pg/mL (ref 232–1245)

## 2017-08-14 LAB — FERRITIN: FERRITIN: 42 ng/mL (ref 15–150)

## 2017-08-15 ENCOUNTER — Telehealth: Payer: Self-pay | Admitting: Certified Nurse Midwife

## 2017-08-15 ENCOUNTER — Telehealth: Payer: Self-pay

## 2017-08-15 NOTE — Telephone Encounter (Signed)
Message left for pt to return my call- re: neg test result per AT.

## 2017-08-15 NOTE — Telephone Encounter (Signed)
The patient called and stated that she missed a call from WhitneyAnnie, and would like a call back if possible. Please advise.

## 2017-08-15 NOTE — Telephone Encounter (Signed)
Pt informed of negative test results per AT. 

## 2017-11-09 ENCOUNTER — Encounter: Payer: No Typology Code available for payment source | Admitting: Certified Nurse Midwife

## 2017-11-23 ENCOUNTER — Encounter: Payer: No Typology Code available for payment source | Admitting: Certified Nurse Midwife

## 2017-12-02 ENCOUNTER — Encounter: Payer: No Typology Code available for payment source | Admitting: Certified Nurse Midwife

## 2019-08-13 ENCOUNTER — Encounter: Payer: Self-pay | Admitting: Certified Nurse Midwife

## 2019-08-13 ENCOUNTER — Ambulatory Visit (INDEPENDENT_AMBULATORY_CARE_PROVIDER_SITE_OTHER): Payer: No Typology Code available for payment source | Admitting: Certified Nurse Midwife

## 2019-08-13 ENCOUNTER — Other Ambulatory Visit: Payer: Self-pay

## 2019-08-13 VITALS — BP 118/74 | HR 79 | Ht 66.0 in | Wt 196.0 lb

## 2019-08-13 DIAGNOSIS — N926 Irregular menstruation, unspecified: Secondary | ICD-10-CM | POA: Diagnosis not present

## 2019-08-13 LAB — POCT URINE PREGNANCY: Preg Test, Ur: POSITIVE — AB

## 2019-08-13 NOTE — Patient Instructions (Signed)

## 2019-08-13 NOTE — Progress Notes (Signed)
Subjective:    Elizabeth Rush is a 24 y.o. female who presents for evaluation of amenorrhea. She believes she could be pregnant. Patient is ambivalent about pregnancy. Was not planning and took Plan B.  Sexual Activity: single partner, contraception: none. Current symptoms also include: fatigue. Last period was normal.   Patient's last menstrual period was 06/27/2019 (exact date). The following portions of the patient's history were reviewed and updated as appropriate: allergies, current medications, past family history, past medical history, past social history, past surgical history and problem list.  Review of Systems Pertinent items are noted in HPI.     Objective:    BP 118/74   Pulse 79   Ht 5\' 6"  (1.676 m)   Wt 196 lb (88.9 kg)   LMP 06/27/2019 (Exact Date)   BMI 31.64 kg/m  General: alert, cooperative, appears stated age, mild distress and no acute distress    Lab Review Urine HCG: positive    Assessment:    Absence of menstruation.     Plan:   Positive: EDC: 04/02/20. Briefly discussed pre-natal care options.Midwifery or MD care reviewed. Pt state that she may be moving to 06/02/20. She states that her husband wants her to get an abortion and is not happy about pregnancy. She is tearful. Discussed referral for counseling. She declines at this time. She does not want an abortion. She has had one in the past and does not want to go through it again.  Encouraged well-balanced diet, plenty of rest when needed, pre-natal vitamins daily and walking for exercise. Discussed self-help for nausea, avoiding OTC medications until consulting provider or pharmacist, other than Tylenol as needed, minimal caffeine (1-2 cups daily) and avoiding alcohol. She will schedule u/s for dating 1 wk, her nurse visit in 3-4 wks and  her initial NOB in 6 wks.  Feel free to call with any questions.   [place], CNM

## 2019-08-21 ENCOUNTER — Ambulatory Visit (INDEPENDENT_AMBULATORY_CARE_PROVIDER_SITE_OTHER): Payer: No Typology Code available for payment source

## 2019-08-21 ENCOUNTER — Other Ambulatory Visit: Payer: Self-pay

## 2019-08-21 DIAGNOSIS — Z3A01 Less than 8 weeks gestation of pregnancy: Secondary | ICD-10-CM

## 2019-08-21 DIAGNOSIS — N926 Irregular menstruation, unspecified: Secondary | ICD-10-CM

## 2019-09-03 ENCOUNTER — Ambulatory Visit (INDEPENDENT_AMBULATORY_CARE_PROVIDER_SITE_OTHER): Payer: No Typology Code available for payment source

## 2019-09-03 VITALS — BP 117/78 | HR 103 | Ht 66.0 in | Wt 192.5 lb

## 2019-09-03 DIAGNOSIS — Z0283 Encounter for blood-alcohol and blood-drug test: Secondary | ICD-10-CM

## 2019-09-03 DIAGNOSIS — R638 Other symptoms and signs concerning food and fluid intake: Secondary | ICD-10-CM | POA: Diagnosis not present

## 2019-09-03 DIAGNOSIS — Z3481 Encounter for supervision of other normal pregnancy, first trimester: Secondary | ICD-10-CM | POA: Diagnosis not present

## 2019-09-03 DIAGNOSIS — Z3A09 9 weeks gestation of pregnancy: Secondary | ICD-10-CM

## 2019-09-03 DIAGNOSIS — Z113 Encounter for screening for infections with a predominantly sexual mode of transmission: Secondary | ICD-10-CM

## 2019-09-03 NOTE — Patient Instructions (Signed)
Second Trimester of Pregnancy  The second trimester is from week 14 through week 27 (month 4 through 6). This is often the time in pregnancy that you feel your best. Often times, morning sickness has lessened or quit. You may have more energy, and you may get hungry more often. Your unborn baby is growing rapidly. At the end of the sixth month, he or she is about 9 inches long and weighs about 1 pounds. You will likely feel the baby move between 18 and 20 weeks of pregnancy. Follow these instructions at home: Medicines  Take over-the-counter and prescription medicines only as told by your doctor. Some medicines are safe and some medicines are not safe during pregnancy.  Take a prenatal vitamin that contains at least 600 micrograms (mcg) of folic acid.  If you have trouble pooping (constipation), take medicine that will make your stool soft (stool softener) if your doctor approves. Eating and drinking   Eat regular, healthy meals.  Avoid raw meat and uncooked cheese.  If you get low calcium from the food you eat, talk to your doctor about taking a daily calcium supplement.  Avoid foods that are high in fat and sugars, such as fried and sweet foods.  If you feel sick to your stomach (nauseous) or throw up (vomit): ? Eat 4 or 5 small meals a day instead of 3 large meals. ? Try eating a few soda crackers. ? Drink liquids between meals instead of during meals.  To prevent constipation: ? Eat foods that are high in fiber, like fresh fruits and vegetables, whole grains, and beans. ? Drink enough fluids to keep your pee (urine) clear or pale yellow. Activity  Exercise only as told by your doctor. Stop exercising if you start to have cramps.  Do not exercise if it is too hot, too humid, or if you are in a place of great height (high altitude).  Avoid heavy lifting.  Wear low-heeled shoes. Sit and stand up straight.  You can continue to have sex unless your doctor tells you not  to. Relieving pain and discomfort  Wear a good support bra if your breasts are tender.  Take warm water baths (sitz baths) to soothe pain or discomfort caused by hemorrhoids. Use hemorrhoid cream if your doctor approves.  Rest with your legs raised if you have leg cramps or low back pain.  If you develop puffy, bulging veins (varicose veins) in your legs: ? Wear support hose or compression stockings as told by your doctor. ? Raise (elevate) your feet for 15 minutes, 3-4 times a day. ? Limit salt in your food. Prenatal care  Write down your questions. Take them to your prenatal visits.  Keep all your prenatal visits as told by your doctor. This is important. Safety  Wear your seat belt when driving.  Make a list of emergency phone numbers, including numbers for family, friends, the hospital, and police and fire departments. General instructions  Ask your doctor about the right foods to eat or for help finding a counselor, if you need these services.  Ask your doctor about local prenatal classes. Begin classes before month 6 of your pregnancy.  Do not use hot tubs, steam rooms, or saunas.  Do not douche or use tampons or scented sanitary pads.  Do not cross your legs for long periods of time.  Visit your dentist if you have not done so. Use a soft toothbrush to brush your teeth. Floss gently.  Avoid all smoking, herbs,   and alcohol. Avoid drugs that are not approved by your doctor.  Do not use any products that contain nicotine or tobacco, such as cigarettes and e-cigarettes. If you need help quitting, ask your doctor.  Avoid cat litter boxes and soil used by cats. These carry germs that can cause birth defects in the baby and can cause a loss of your baby (miscarriage) or stillbirth. Contact a doctor if:  You have mild cramps or pressure in your lower belly.  You have pain when you pee (urinate).  You have bad smelling fluid coming from your vagina.  You continue to  feel sick to your stomach (nauseous), throw up (vomit), or have watery poop (diarrhea).  You have a nagging pain in your belly area.  You feel dizzy. Get help right away if:  You have a fever.  You are leaking fluid from your vagina.  You have spotting or bleeding from your vagina.  You have severe belly cramping or pain.  You lose or gain weight rapidly.  You have trouble catching your breath and have chest pain.  You notice sudden or extreme puffiness (swelling) of your face, hands, ankles, feet, or legs.  You have not felt the baby move in over an hour.  You have severe headaches that do not go away when you take medicine.  You have trouble seeing. Summary  The second trimester is from week 14 through week 27 (months 4 through 6). This is often the time in pregnancy that you feel your best.  To take care of yourself and your unborn baby, you will need to eat healthy meals, take medicines only if your doctor tells you to do so, and do activities that are safe for you and your baby.  Call your doctor if you get sick or if you notice anything unusual about your pregnancy. Also, call your doctor if you need help with the right food to eat, or if you want to know what activities are safe for you. This information is not intended to replace advice given to you by your health care provider. Make sure you discuss any questions you have with your health care provider. Document Revised: 05/12/2018 Document Reviewed: 02/24/2016 Elsevier Patient Education  [place]. Morning Sickness  Morning sickness is when you feel sick to your stomach (nauseous) during pregnancy. You may feel sick to your stomach and throw up (vomit). You may feel sick in the morning, but you can feel this way at any time of day. Some women feel very sick to their stomach and cannot stop throwing up (hyperemesis gravidarum). Follow these instructions at home: Medicines  Take over-the-counter and  prescription medicines only as told by your doctor. Do not take any medicines until you talk with your doctor about them first.  Taking multivitamins before getting pregnant can stop or lessen the harshness of morning sickness. Eating and drinking  Eat dry toast or crackers before getting out of bed.  Eat 5 or 6 small meals a day.  Eat dry and bland foods like rice and baked potatoes.  Do not eat greasy, fatty, or spicy foods.  Have someone cook for you if the smell of food causes you to feel sick or throw up.  If you feel sick to your stomach after taking prenatal vitamins, take them at night or with a snack.  Eat protein when you need a snack. Nuts, yogurt, and cheese are good choices.  Drink fluids throughout the day.  Try ginger ale  made with real ginger, ginger tea made from fresh grated ginger, or ginger candies. General instructions  Do not use any products that have nicotine or tobacco in them, such as cigarettes and e-cigarettes. If you need help quitting, ask your doctor.  Use an air purifier to keep the air in your house free of smells.  Get lots of fresh air.  Try to avoid smells that make you feel sick.  Try: ? Wearing a bracelet that is used for seasickness (acupressure wristband). ? Going to a doctor who puts thin needles into certain body points (acupuncture) to improve how you feel. Contact a doctor if:  You need medicine to feel better.  You feel dizzy or light-headed.  You are losing weight. Get help right away if:  You feel very sick to your stomach and cannot stop throwing up.  You pass out (faint).  You have very bad pain in your belly. Summary  Morning sickness is when you feel sick to your stomach (nauseous) during pregnancy.  You may feel sick in the morning, but you can feel this way at any time of day.  Making some changes to what you eat may help your symptoms go away. This information is not intended to replace advice given to you  by your health care provider. Make sure you discuss any questions you have with your health care provider. Document Revised: 12/31/2016 Document Reviewed: 02/19/2016 Elsevier Patient Education  2020 [employer]. How a Baby Grows During Pregnancy  Pregnancy begins when a female's sperm enters a female's egg (fertilization). Fertilization usually happens in one of the tubes (fallopian tubes) that connect the ovaries to the womb (uterus). The fertilized egg moves down the fallopian tube to the uterus. Once it reaches the uterus, it implants into the lining of the uterus and begins to grow. For the first 10 weeks, the fertilized egg is called an embryo. After 10 weeks, it is called a fetus. As the fetus continues to grow, it receives oxygen and nutrients through tissue (placenta) that grows to support the developing baby. The placenta is the life support system for the baby. It provides oxygen and nutrition and removes waste. Learning as much as you can about your pregnancy and how your baby is developing can help you enjoy the experience. It can also make you aware of when there might be a problem and when to ask questions. How long does a typical pregnancy last? A pregnancy usually lasts 280 days, or about 40 weeks. Pregnancy is divided into three periods of growth, also called trimesters:  First trimester: 0-12 weeks.  Second trimester: 13-27 weeks.  Third trimester: 28-40 weeks. The day when your baby is ready to be born (full term) is your estimated date of delivery. How does my baby develop month by month? First month  The fertilized egg attaches to the inside of the uterus.  Some cells will form the placenta. Others will form the fetus.  The arms, legs, brain, spinal cord, lungs, and heart begin to develop.  At the end of the first month, the heart begins to beat. Second month  The bones, inner ear, eyelids, hands, and feet form.  The genitals develop.  By the end of 8 weeks, all  major organs are developing. Third month  All of the internal organs are forming.  Teeth develop below the gums.  Bones and muscles begin to grow. The spine can flex.  The skin is transparent.  Fingernails and toenails begin to form.  Arms and legs continue to grow longer, and hands and feet develop.  The fetus is about 3 inches (7.6 cm) long. Fourth month  The placenta is completely formed.  The external sex organs, neck, outer ear, eyebrows, eyelids, and fingernails are formed.  The fetus can hear, swallow, and move its arms and legs.  The kidneys begin to produce urine.  The skin is covered with a white, waxy coating (vernix) and very fine hair (lanugo). Fifth month  The fetus moves around more and can be felt for the first time (quickening).  The fetus starts to sleep and wake up and may begin to suck its finger.  The nails grow to the end of the fingers.  The organ in the digestive system that makes bile (gallbladder) functions and helps to digest nutrients.  If your baby is a girl, eggs are present in her ovaries. If your baby is a boy, testicles start to move down into his scrotum. Sixth month  The lungs are formed.  The eyes open. The brain continues to develop.  Your baby has fingerprints and toe prints. Your baby's hair grows thicker.  At the end of the second trimester, the fetus is about 9 inches (22.9 cm) long. Seventh month  The fetus kicks and stretches.  The eyes are developed enough to sense changes in light.  The hands can make a grasping motion.  The fetus responds to sound. Eighth month  All organs and body systems are fully developed and functioning.  Bones harden, and taste buds develop. The fetus may hiccup.  Certain areas of the brain are still developing. The skull remains soft. Ninth month  The fetus gains about  lb (0.23 kg) each week.  The lungs are fully developed.  Patterns of sleep develop.  The fetus's head  typically moves into a head-down position (vertex) in the uterus to prepare for birth.  The fetus weighs 6-9 lb (2.72-4.08 kg) and is 19-20 inches (48.26-50.8 cm) long. What can I do to have a healthy pregnancy and help my baby develop? General instructions  Take prenatal vitamins as directed by your health care provider. These include vitamins such as folic acid, iron, calcium, and vitamin D. They are important for healthy development.  Take medicines only as directed by your health care provider. Read labels and ask a pharmacist or your health care provider whether over-the-counter medicines, supplements, and prescription drugs are safe to take during pregnancy.  Keep all follow-up visits as directed by your health care provider. This is important. Follow-up visits include prenatal care and screening tests. How do I know if my baby is developing well? At each prenatal visit, your health care provider will do several different tests to check on your health and keep track of your baby's development. These include:  Fundal height and position. ? Your health care provider will measure your growing belly from your pubic bone to the top of the uterus using a tape measure. ? Your health care provider will also feel your belly to determine your baby's position.  Heartbeat. ? An ultrasound in the first trimester can confirm pregnancy and show a heartbeat, depending on how far along you are. ? Your health care provider will check your baby's heart rate at every prenatal visit.  Second trimester ultrasound. ? This ultrasound checks your baby's development. It also may show your baby's gender. What should I do if I have concerns about my baby's development? Always talk with your health care provider about  any concerns that you may have about your pregnancy and your baby. Summary  A pregnancy usually lasts 280 days, or about 40 weeks. Pregnancy is divided into three periods of growth, also called  trimesters.  Your health care provider will monitor your baby's growth and development throughout your pregnancy.  Follow your health care provider's recommendations about taking prenatal vitamins and medicines during your pregnancy.  Talk with your health care provider if you have any concerns about your pregnancy or your developing baby. This information is not intended to replace advice given to you by your health care provider. Make sure you discuss any questions you have with your health care provider. Document Revised: 05/11/2018 Document Reviewed: 12/01/2016 Elsevier Patient Education  2020 [employer]. Commonly Asked Questions During Pregnancy  Cats: A parasite can be excreted in cat feces.  To avoid exposure you need to have another person empty the little box.  If you must empty the litter box you will need to wear gloves.  Wash your hands after handling your cat.  This parasite can also be found in raw or undercooked meat so this should also be avoided.  Colds, Sore Throats, Flu: Please check your medication sheet to see what you can take for symptoms.  If your symptoms are unrelieved by these medications please call the office.  Dental Work: Most any dental work Investment banker, corporate recommends is permitted.  X-rays should only be taken during the first trimester if absolutely necessary.  Your abdomen should be shielded with a lead apron during all x-rays.  Please notify your provider prior to receiving any x-rays.  Novocaine is fine; gas is not recommended.  If your dentist requires a note from [place] prior to dental work please call the office and we will provide one for you.  Exercise: Exercise is an important part of staying healthy during your pregnancy.  You may continue most exercises you were accustomed to prior to pregnancy.  Later in your pregnancy you will most likely notice you have difficulty with activities requiring balance like riding a bicycle.  It is important that you listen to  your body and avoid activities that put you at a higher risk of falling.  Adequate rest and staying well hydrated are a must!  If you have questions about the safety of specific activities ask your provider.    Exposure to Children with illness: Try to avoid obvious exposure; report any symptoms to [place] when noted,  If you have chicken pos, red measles or mumps, you should be immune to these diseases.   Please do not take any vaccines while pregnant unless you have checked with your OB provider.  Fetal Movement: After 28 weeks we recommend you do "kick counts" twice daily.  Lie or sit down in a calm quiet environment and count your baby movements "kicks".  You should feel your baby at least 10 times per hour.  If you have not felt 10 kicks within the first hour get up, walk around and have something sweet to eat or drink then repeat for an additional hour.  If count remains less than 10 per hour notify your provider.  Fumigating: Follow your pest control agent's advice as to how long to stay out of your home.  Ventilate the area well before re-entering.  Hemorrhoids:   Most over-the-counter preparations can be used during pregnancy.  Check your medication to see what is safe to use.  It is important to use a stool softener or fiber  in your diet and to drink lots of liquids.  If hemorrhoids seem to be getting worse please call the office.   Hot Tubs:  Hot tubs Jacuzzis and saunas are not recommended while pregnant.  These increase your internal body temperature and should be avoided.  Intercourse:  Sexual intercourse is safe during pregnancy as long as you are comfortable, unless otherwise advised by your provider.  Spotting may occur after intercourse; report any bright red bleeding that is heavier than spotting.  Labor:  If you know that you are in labor, please go to the hospital.  If you are unsure, please call the office and let us help you decide what to do.  Lifting, straining, etc:  If your job  requires heavy lifting or straining please check with your provider for any limitations.  Generally, you should not lift items heavier than that you can lift simply with your hands and arms (no back muscles)  Painting:  Paint fumes do not harm your pregnancy, but may make you ill and should be avoided if possible.  Latex or water based paints have less odor than oils.  Use adequate ventilation while painting.  Permanents & Hair Color:  Chemicals in hair dyes are not recommended as they cause increase hair dryness which can increase hair loss during pregnancy.  " Highlighting" and permanents are allowed.  Dye may be absorbed differently and permanents may not hold as well during pregnancy.  Sunbathing:  Use a sunscreen, as skin burns easily during pregnancy.  Drink plenty of fluids; avoid over heating.  Tanning Beds:  Because their possible side effects are still unknown, tanning beds are not recommended.  Ultrasound Scans:  Routine ultrasounds are performed at approximately 20 weeks.  You will be able to see your baby's general anatomy an if you would like to know the gender this can usually be determined as well.  If it is questionable when you conceived you may also receive an ultrasound early in your pregnancy for dating purposes.  Otherwise ultrasound exams are not routinely performed unless there is a medical necessity.  Although you can request a scan we ask that you pay for it when conducted because insurance does not cover " patient request" scans.  Work: If your pregnancy proceeds without complications you may work until your due date, unless your physician or employer advises otherwise.  Round Ligament Pain/Pelvic Discomfort:  Sharp, shooting pains not associated with bleeding are fairly common, usually occurring in the second trimester of pregnancy.  They tend to be worse when standing up or when you remain standing for long periods of time.  These are the result of pressure of certain  pelvic ligaments called "round ligaments".  Rest, Tylenol and heat seem to be the most effective relief.  As the womb and fetus grow, they rise out of the pelvis and the discomfort improves.  Please notify the office if your pain seems different than that described.  It may represent a more serious condition.  Common Medications Safe in Pregnancy  Acne:      Constipation:  Benzoyl Peroxide     Colace  Clindamycin      Dulcolax Suppository  Topica Erythromycin     Fibercon  Salicylic Acid      Metamucil         Miralax AVOID:        Senakot   Accutane    Cough:  Retin-A       Cough Drops  Tetracycline  Phenergan w/ Codeine if Rx  Minocycline      Robitussin (Plain & DM)  Antibiotics:     Crabs/Lice:  Ceclor       RID  Cephalosporins    AVOID:  E-Mycins      Kwell  Keflex  Macrobid/Macrodantin   Diarrhea:  Penicillin      Kao-Pectate  Zithromax      Imodium AD         PUSH FLUIDS AVOID:       Cipro     Fever:  Tetracycline      Tylenol (Regular or Extra  Minocycline       Strength)  Levaquin      Extra Strength-Do not          Exceed 8 tabs/24 hrs Caffeine:        '200mg'$ /day (equiv. To 1 cup of coffee or  approx. 3 12 oz sodas)         Gas: Cold/Hayfever:       Gas-X  Benadryl      Mylicon  Claritin       Phazyme  **Claritin-D        Chlor-Trimeton    Headaches:  Dimetapp      ASA-Free Excedrin  Drixoral-Non-Drowsy     Cold Compress  Mucinex (Guaifenasin)     Tylenol (Regular or Extra  Sudafed/Sudafed-12 Hour     Strength)  **Sudafed PE Pseudoephedrine   Tylenol Cold & Sinus     Vicks Vapor Rub  Zyrtec  **AVOID if Problems With Blood Pressure         Heartburn: Avoid lying down for at least 1 hour after meals  Aciphex      Maalox     Rash:  Milk of Magnesia     Benadryl    Mylanta       1% Hydrocortisone Cream  Pepcid  Pepcid Complete   Sleep Aids:  Prevacid      Ambien   Prilosec       Benadryl  Rolaids       Chamomile Tea  Tums (Limit  4/day)     Unisom         Tylenol PM         Warm milk-add vanilla or  Hemorrhoids:       Sugar for taste  Anusol/Anusol H.C.  (RX: Analapram 2.5%)  Sugar Substitutes:  Hydrocortisone OTC     Ok in moderation  Preparation H      Tucks        Vaseline lotion applied to tissue with wiping    Herpes:     Throat:  Acyclovir      Oragel  Famvir  Valtrex     Vaccines:         Flu Shot Leg Cramps:       *Gardasil  Benadryl      Hepatitis A         Hepatitis B Nasal Spray:       Pneumovax  Saline Nasal Spray     Polio Booster         Tetanus Nausea:       Tuberculosis test or PPD  Vitamin B6 25 mg TID   AVOID:    Dramamine      *Gardasil  Emetrol       Live Poliovirus  Ginger Root 250 mg QID    MMR (measles, mumps &  High Complex Carbs @ Bedtime    rebella)  Sea Bands-Accupressure    Varicella (Chickenpox)  Unisom 1/2 tab TID     *No known complications           If received before Pain:         Known pregnancy;   Darvocet       Resume series after  Lortab        Delivery  Percocet    Yeast:   Tramadol      Femstat  Tylenol 3      Gyne-lotrimin  Ultram       Monistat  Vicodin           MISC:         All Sunscreens           Hair Coloring/highlights          Insect Repellant's          (Including DEET)         Mystic Tans

## 2019-09-03 NOTE — Progress Notes (Signed)
      Nils Pyle presents for NOB nurse intake visit. Pregnancy confirmation done at [facility], 08/13/2019, with Doreene Burke.  G3.  P 2002.  LMP 06/27/2019.  EDD 04/05/2020.  [place] [redacted]w[redacted]d. Pregnancy education material explained and given.  2 cats, 1 ferret and 1 hamster in the home.  NOB labs ordered. BMI greater than 30. TSH/HbgA1c ordered. Sickle cell not order due to race. HIV and drug screen explained and ordered. Genetic screening discussed. Genetic testing; Unsure. Pt to discuss genetic testing with provider. PNV encouraged. Pt to follow up with provider in  3 weeks for NOB physical.  FMLA forms and [facility] Financial Policy reviewed and signed by patient.

## 2019-09-04 LAB — HEMOGLOBIN A1C
Est. average glucose Bld gHb Est-mCnc: 94 mg/dL
Hgb A1c MFr Bld: 4.9 % (ref 4.8–5.6)

## 2019-09-04 LAB — URINALYSIS, ROUTINE W REFLEX MICROSCOPIC
Bilirubin, UA: NEGATIVE
Glucose, UA: NEGATIVE
Ketones, UA: NEGATIVE
Leukocytes,UA: NEGATIVE
Nitrite, UA: NEGATIVE
Protein,UA: NEGATIVE
RBC, UA: NEGATIVE
Specific Gravity, UA: 1.025 (ref 1.005–1.030)
Urobilinogen, Ur: 0.2 mg/dL (ref 0.2–1.0)
pH, UA: 7 (ref 5.0–7.5)

## 2019-09-04 LAB — TOXOPLASMA ANTIBODIES- IGG AND  IGM
Toxoplasma Antibody- IgM: <3 AU/mL (ref 0.0–7.9)
Toxoplasma IgG Ratio: <3 IU/mL (ref 0.0–7.1)

## 2019-09-04 LAB — DRUG PROFILE, UR, 9 DRUGS (LABCORP)
Amphetamines, Urine: NEGATIVE ng/mL
Barbiturate Quant, Ur: NEGATIVE ng/mL
Benzodiazepine Quant, Ur: NEGATIVE ng/mL
Cannabinoid Quant, Ur: NEGATIVE ng/mL
Cocaine (Metab.): NEGATIVE ng/mL
Methadone Screen, Urine: NEGATIVE ng/mL
Opiate Quant, Ur: NEGATIVE ng/mL
PCP Quant, Ur: NEGATIVE ng/mL
Propoxyphene: NEGATIVE ng/mL

## 2019-09-04 LAB — NICOTINE SCREEN, URINE: Cotinine Ql Scrn, Ur: NEGATIVE ng/mL

## 2019-09-04 LAB — VARICELLA ZOSTER ANTIBODY, IGG: Varicella zoster IgG: 1639 index (ref >165)

## 2019-09-04 LAB — RUBELLA SCREEN: Rubella Antibodies, IGG: 2.05 index (ref >0.99)

## 2019-09-04 LAB — HIV ANTIBODY (ROUTINE TESTING W REFLEX): HIV Screen 4th Generation wRfx: NONREACTIVE

## 2019-09-04 LAB — RPR: RPR Ser Ql: NONREACTIVE

## 2019-09-04 LAB — TSH: TSH: 0.516 u[IU]/mL (ref 0.450–4.500)

## 2019-09-04 LAB — HEPATITIS B SURFACE ANTIGEN: Hepatitis B Surface Ag: NEGATIVE

## 2019-09-05 LAB — URINE CULTURE, OB REFLEX

## 2019-09-05 LAB — CULTURE, OB URINE

## 2019-09-05 LAB — GC/CHLAMYDIA PROBE AMP
Chlamydia trachomatis, NAA: NEGATIVE
Neisseria Gonorrhoeae by PCR: NEGATIVE

## 2019-09-10 ENCOUNTER — Encounter: Payer: No Typology Code available for payment source | Admitting: Certified Nurse Midwife

## 2019-09-22 IMAGING — US US ABDOMEN LIMITED
1 series · 14 of 25 positions shown · non-contrast
Comparison: None.

CLINICAL DATA: Right upper quadrant pain

EXAM:
ULTRASOUND ABDOMEN LIMITED RIGHT UPPER QUADRANT

[Series 1: us abdomen limited · 14 of 42 slices shown]
[im 1/42]
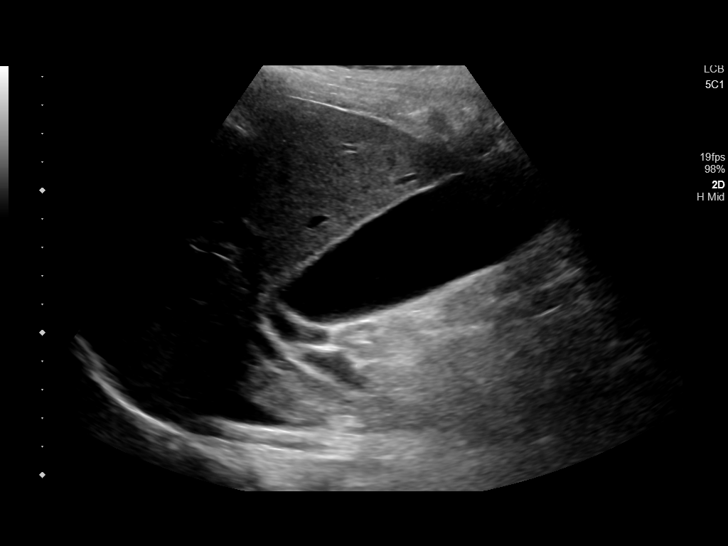
[im 4/42]
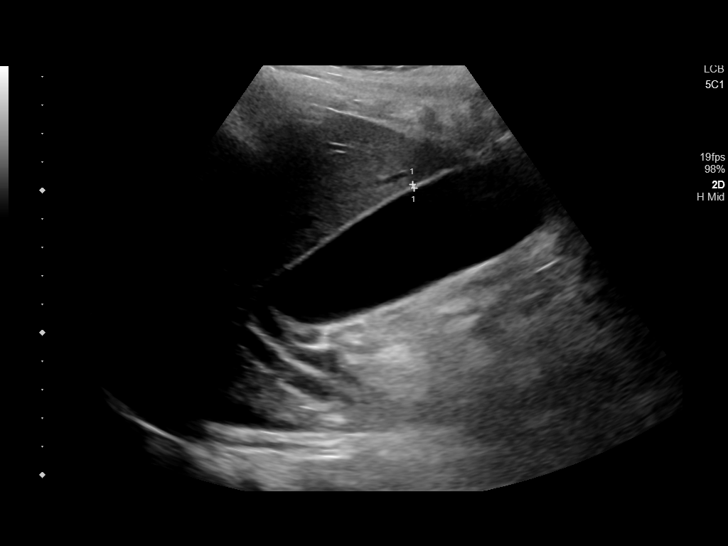
[im 7/42]
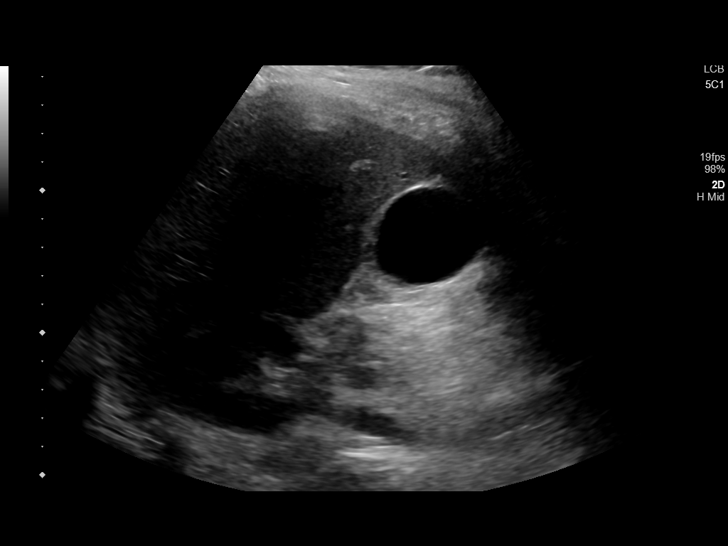
[im 11/42]
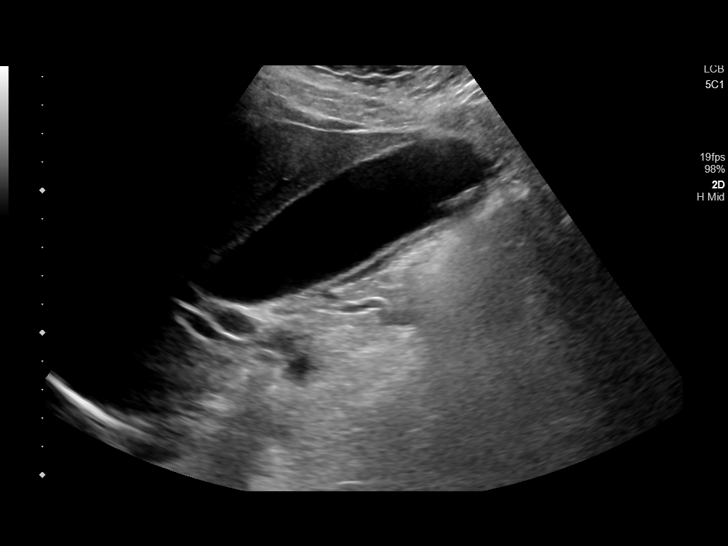
[im 14/42]
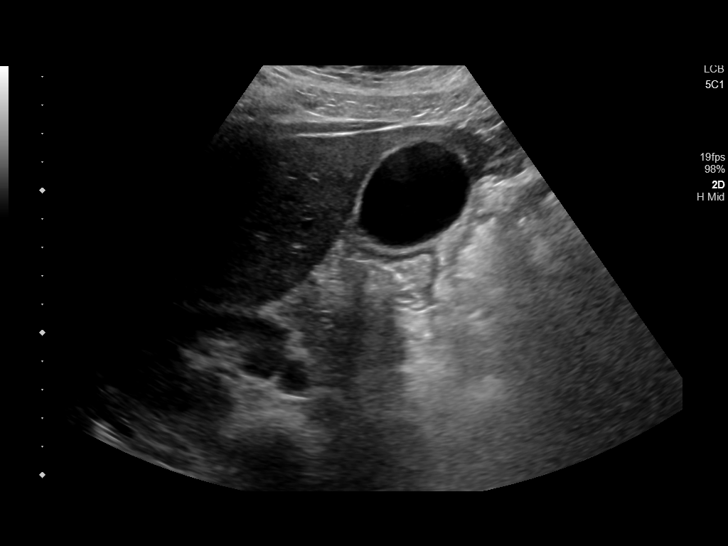
[im 16/42]
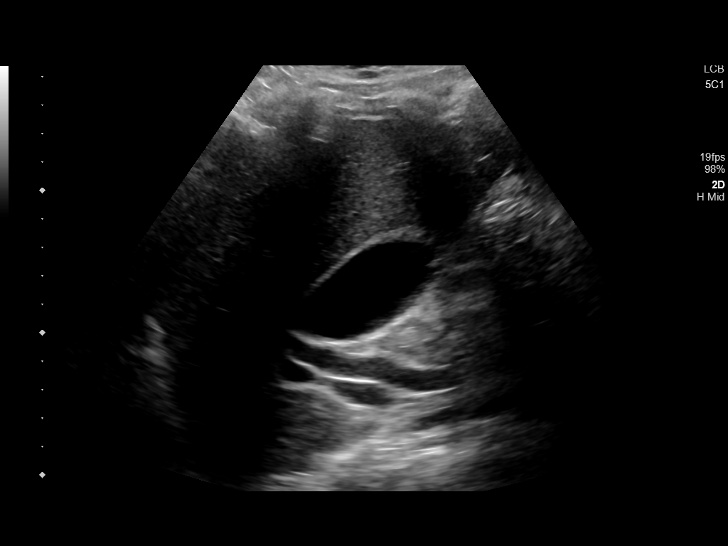
[im 19/42]
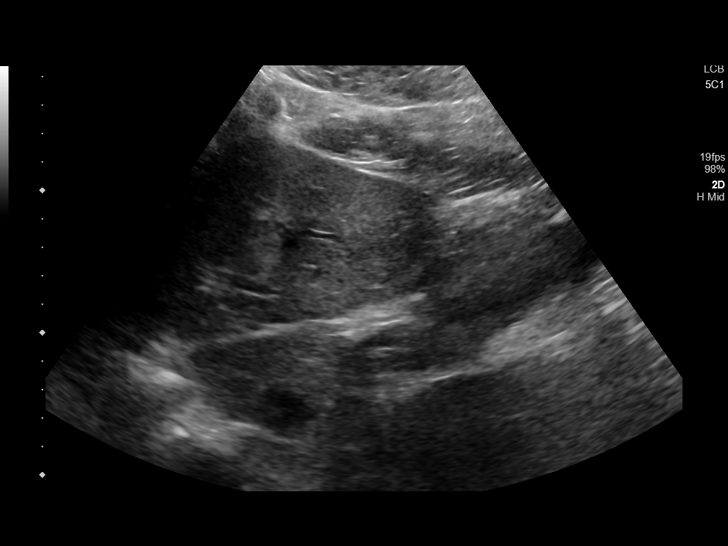
[im 23/42]
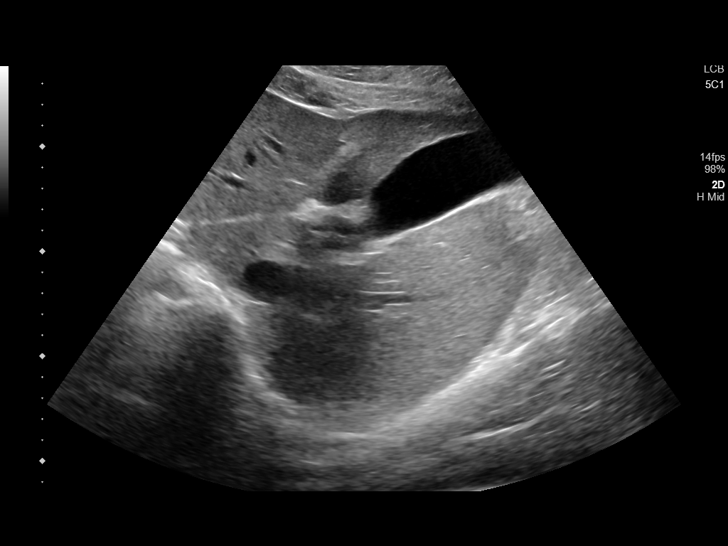
[im 26/42]
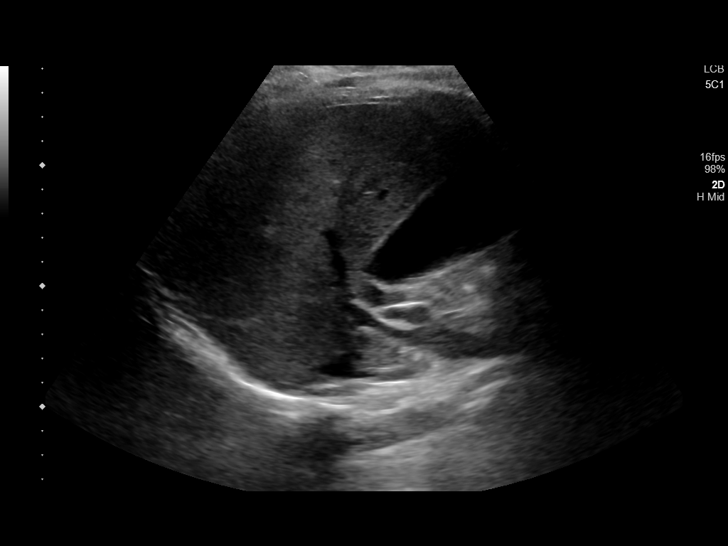
[im 28/42]
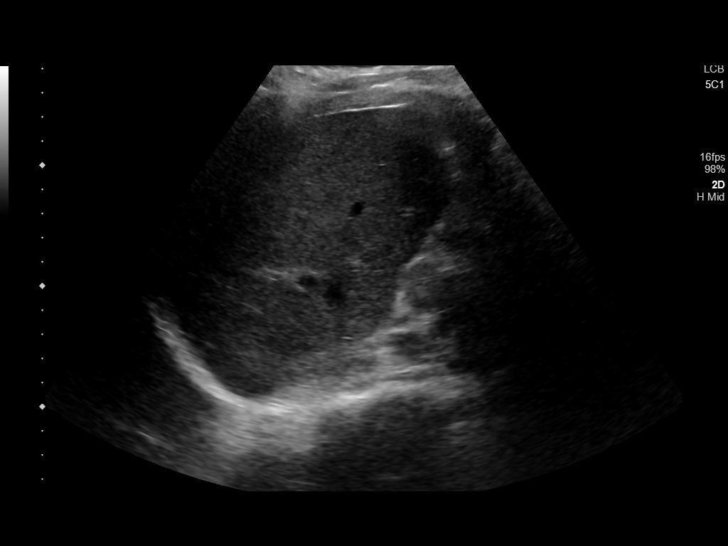
[im 31/42]
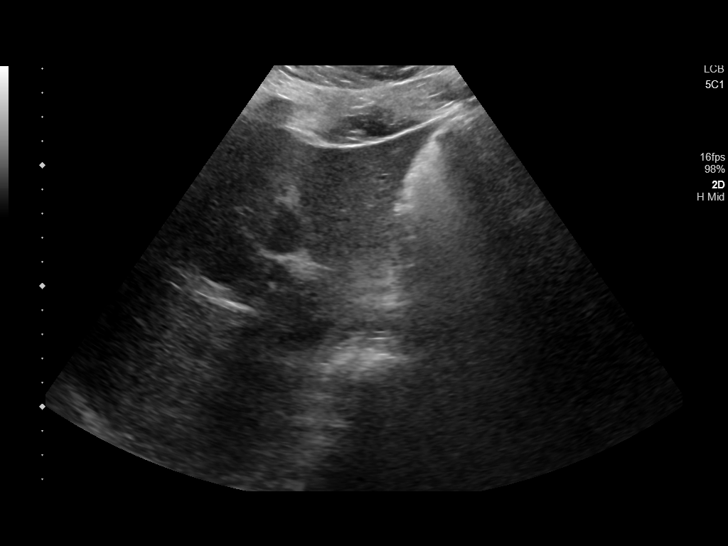
[im 35/42]
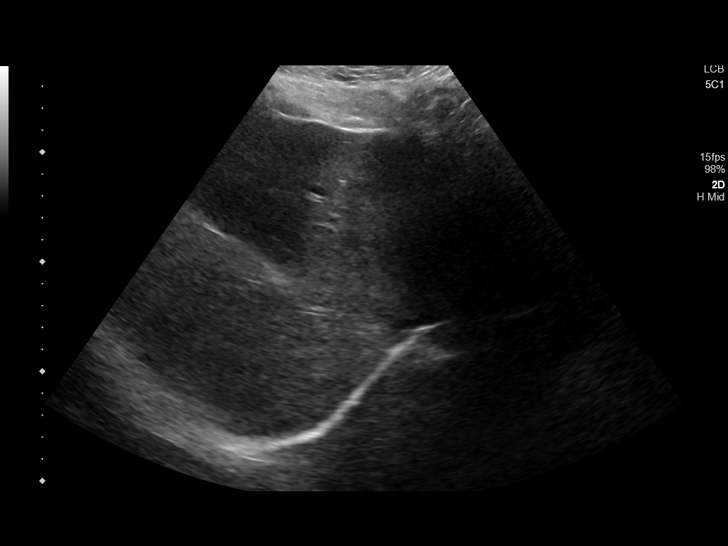
[im 38/42]
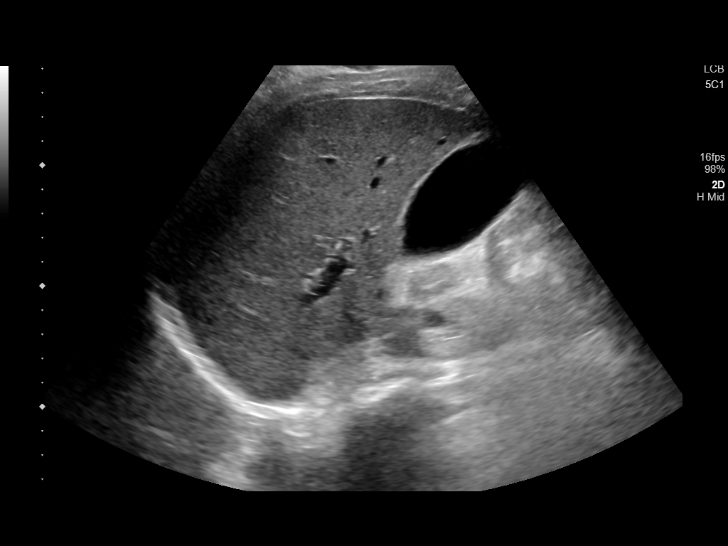
[im 42/42]
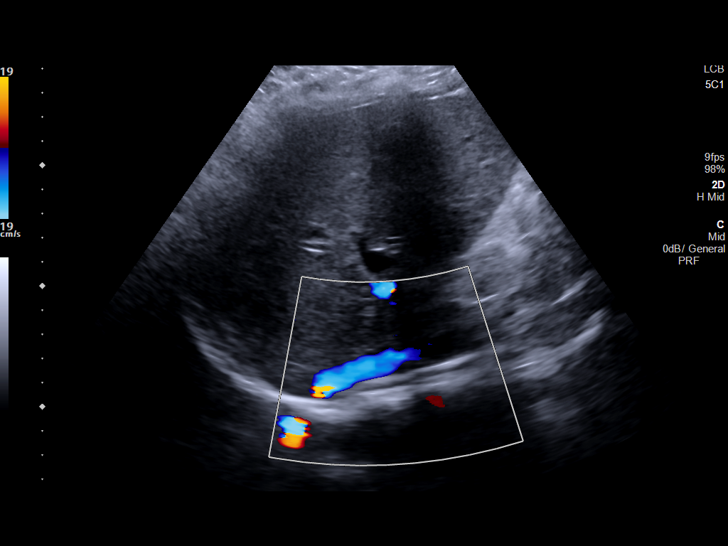

[14 of 25 positions shown; findings below may reference images not displayed]

FINDINGS: Gallbladder:

Within the gallbladder, there is a 1.4 cm echogenic focus which
moves in shadows consistent with cholelithiasis. No gallbladder wall
thickening or pericholecystic fluid. No sonographic Murphy sign
noted by sonographer.

Common bile duct:

Diameter: 7 mm, upper normal. No biliary duct mass or calculus
evident.

Liver:

No focal lesion identified. Within normal limits in parenchymal
echogenicity. Portal vein is patent on color Doppler imaging with
normal direction of blood flow towards the liver.
IMPRESSION: Cholelithiasis. Upper normal common bile duct without biliary duct
mass or calculus evident by ultrasound. No liver lesions evident.

## 2019-09-25 ENCOUNTER — Encounter: Payer: No Typology Code available for payment source | Admitting: Certified Nurse Midwife

## 2019-09-25 IMAGING — XA DG CHOLANGIOGRAM OPERATIVE
2 series · 7 of 7 positions shown · non-contrast
Comparison: MR 05/16/2017

CLINICAL DATA: 22-year-old female with a history
choledocholithiasis

EXAM:
INTRAOPERATIVE CHOLANGIOGRAM
TECHNIQUE: Cholangiographic images from the C-arm fluoroscopic device were
submitted for interpretation post-operatively. Please see the
procedural report for the amount of contrast and the fluoroscopy
time utilized.

[Series 7: cont. · 4 of 32 frames shown (1 of 2)]
[frame 1/32]
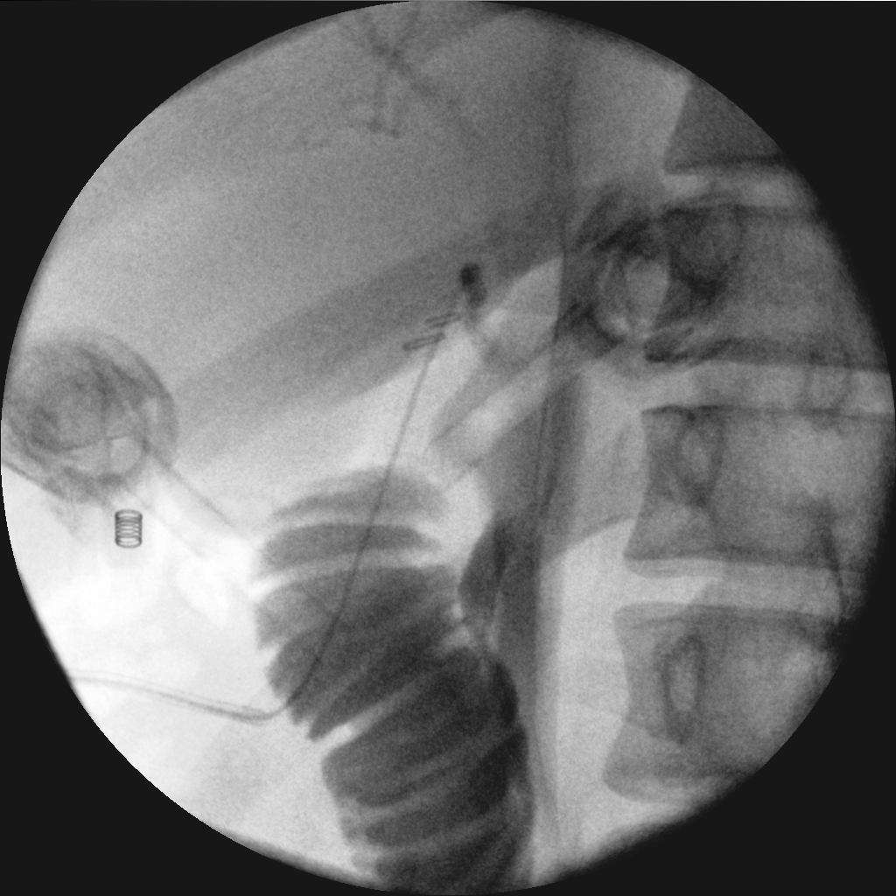
[frame 5/32]
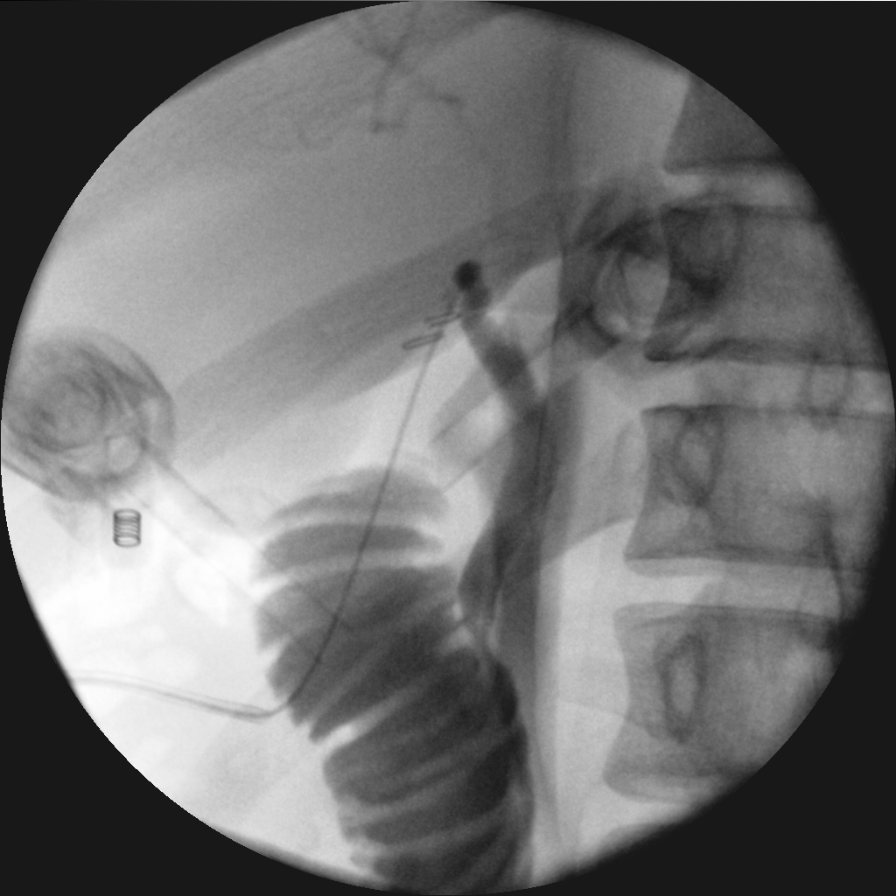
[frame 17/32]
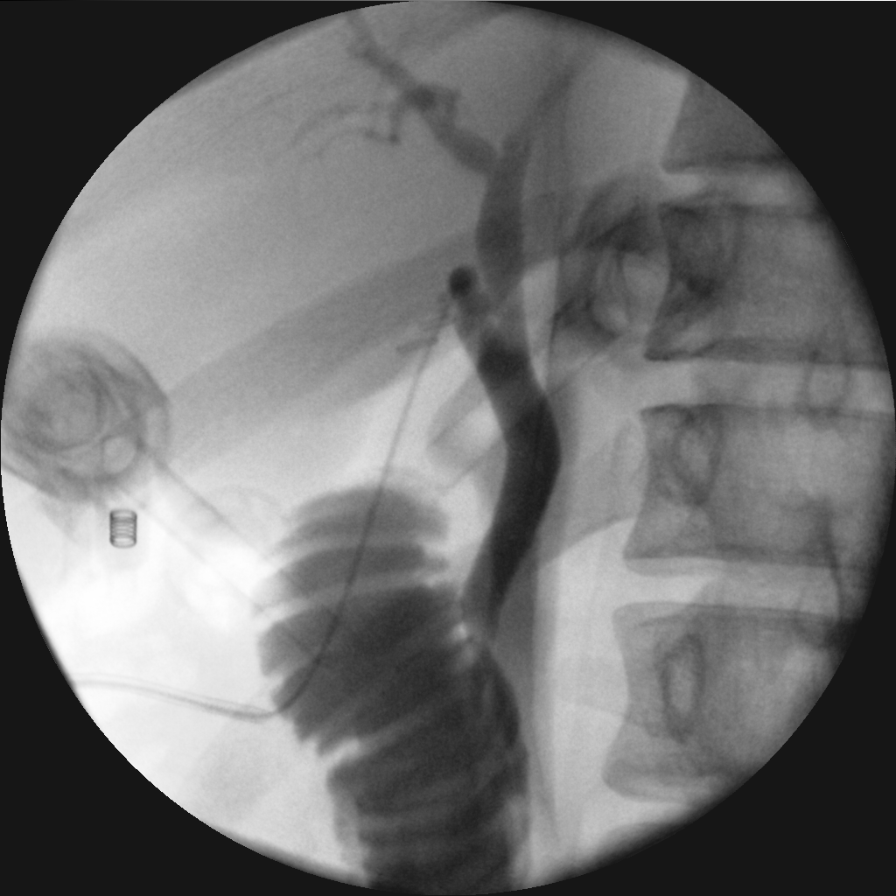
[frame 28/32]
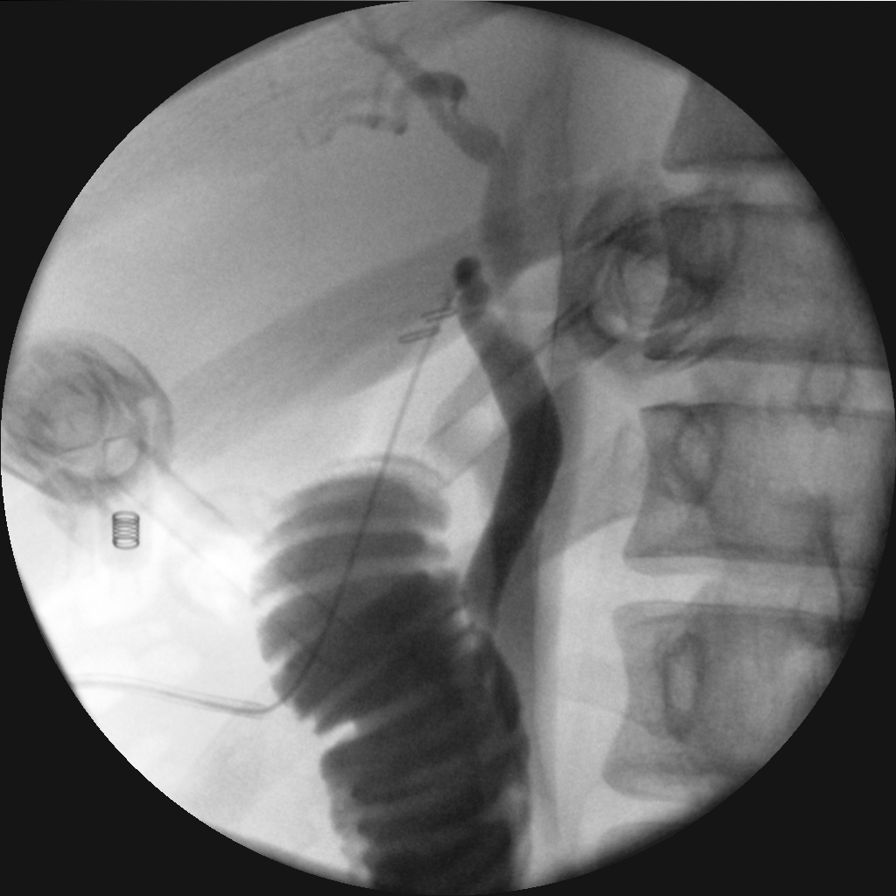

[Series 8: cont. · 3 of 7 frames shown (2 of 2)]
[frame 2/7]
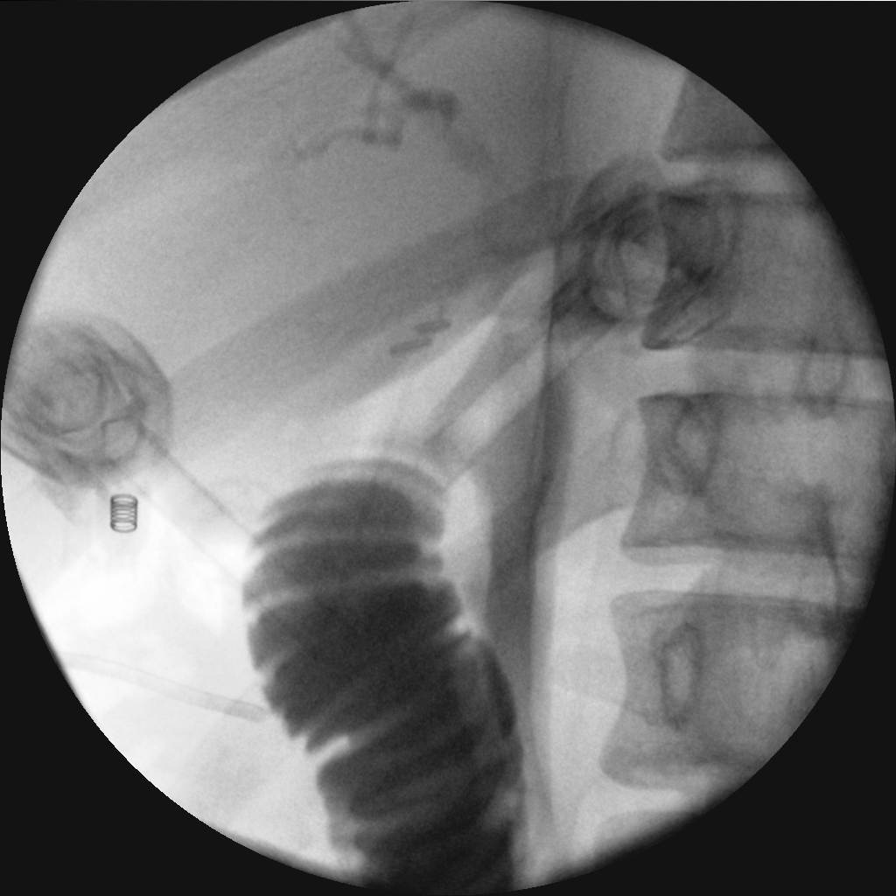
[frame 4/7]
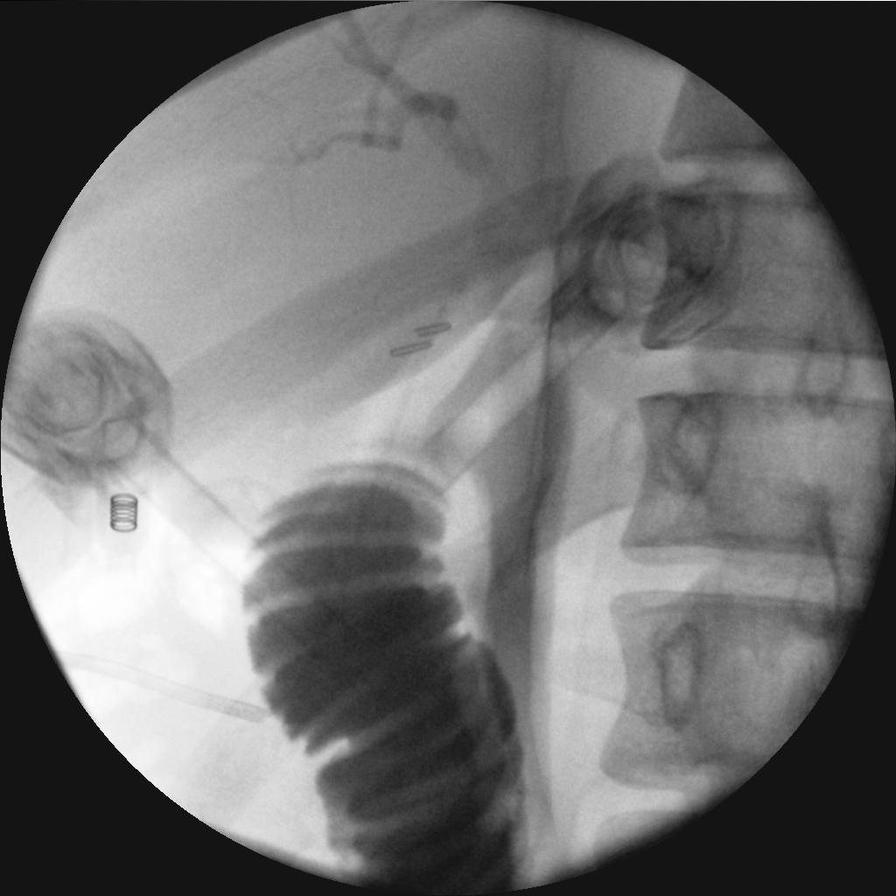
[frame 6/7]
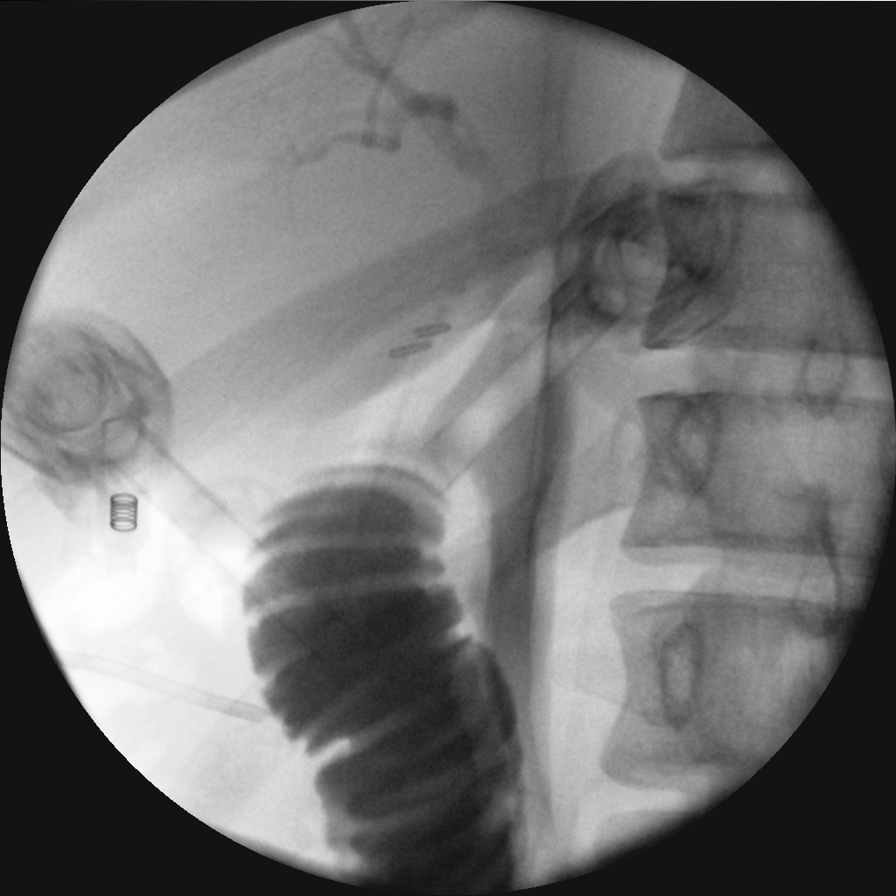

[7 of 7 positions shown; findings below may reference images not displayed]

FINDINGS: Surgical instruments project over the upper abdomen.

There is cannulation of the cystic duct/gallbladder neck, with
antegrade infusion of contrast. Caliber of the extrahepatic ductal
system within normal limits.

No definite filling defect within the extrahepatic ducts identified.

Free flow of contrast across the ampulla.
IMPRESSION: Intraoperative cholangiogram demonstrates extrahepatic biliary ducts
of unremarkable caliber, with no definite filling defects
identified. Free flow of contrast across the ampulla.

Please refer to the dictated operative report for full details of
intraoperative findings and procedure

## 2020-06-10 ENCOUNTER — Other Ambulatory Visit: Payer: Self-pay

## 2020-07-29 ENCOUNTER — Other Ambulatory Visit: Payer: Self-pay
